# Patient Record
Sex: Male | Born: 1960
Health system: Southern US, Community
[De-identification: ages and names within clinical notes are randomized; demographics above are authoritative.]

## PROBLEM LIST (undated history)

## (undated) DIAGNOSIS — D229 Melanocytic nevi, unspecified: Secondary | ICD-10-CM

## (undated) DIAGNOSIS — K635 Polyp of colon: Secondary | ICD-10-CM

## (undated) DIAGNOSIS — N5082 Scrotal pain: Secondary | ICD-10-CM

## (undated) DIAGNOSIS — G4730 Sleep apnea, unspecified: Secondary | ICD-10-CM

## (undated) DIAGNOSIS — M19079 Primary osteoarthritis, unspecified ankle and foot: Secondary | ICD-10-CM

## (undated) DIAGNOSIS — H43399 Other vitreous opacities, unspecified eye: Secondary | ICD-10-CM

## (undated) DIAGNOSIS — E663 Overweight: Secondary | ICD-10-CM

## (undated) DIAGNOSIS — R0683 Snoring: Secondary | ICD-10-CM

## (undated) DIAGNOSIS — Z8249 Family history of ischemic heart disease and other diseases of the circulatory system: Secondary | ICD-10-CM

## (undated) DIAGNOSIS — D72819 Decreased white blood cell count, unspecified: Secondary | ICD-10-CM

## (undated) DIAGNOSIS — R109 Unspecified abdominal pain: Secondary | ICD-10-CM

## (undated) DIAGNOSIS — M5136 Other intervertebral disc degeneration, lumbar region: Secondary | ICD-10-CM

## (undated) DIAGNOSIS — M51369 Other intervertebral disc degeneration, lumbar region without mention of lumbar back pain or lower extremity pain: Secondary | ICD-10-CM

## (undated) DIAGNOSIS — E785 Hyperlipidemia, unspecified: Secondary | ICD-10-CM

## (undated) DIAGNOSIS — E781 Pure hyperglyceridemia: Secondary | ICD-10-CM

## (undated) HISTORY — DX: Melanocytic nevi, unspecified: D22.9

## (undated) HISTORY — DX: Other vitreous opacities, unspecified eye: H43.399

## (undated) HISTORY — DX: Overweight: E66.3

## (undated) HISTORY — DX: Pure hyperglyceridemia: E78.1

## (undated) HISTORY — DX: Snoring: R06.83

## (undated) HISTORY — DX: Hyperlipidemia, unspecified: E78.5

## (undated) HISTORY — DX: Decreased white blood cell count, unspecified: D72.819

## (undated) HISTORY — DX: Other intervertebral disc degeneration, lumbar region without mention of lumbar back pain or lower extremity pain: M51.369

## (undated) HISTORY — DX: Primary osteoarthritis, unspecified ankle and foot: M19.079

## (undated) HISTORY — DX: Unspecified abdominal pain: R10.9

## (undated) HISTORY — DX: Scrotal pain: N50.82

## (undated) HISTORY — DX: Polyp of colon: K63.5

## (undated) HISTORY — DX: Family history of ischemic heart disease and other diseases of the circulatory system: Z82.49

## (undated) HISTORY — DX: Other intervertebral disc degeneration, lumbar region: M51.36

---

## 2000-02-26 HISTORY — PX: VASECTOMY: SHX75

## 2006-02-25 HISTORY — PX: VASECTOMY REVERSAL: SHX243

## 2010-11-15 ENCOUNTER — Ambulatory Visit: Payer: Self-pay | Admitting: Internal Medicine

## 2011-02-26 HISTORY — PX: OTHER SURGICAL HISTORY: SHX169

## 2013-03-04 ENCOUNTER — Encounter: Payer: Self-pay | Admitting: Gastroenterology

## 2013-03-19 ENCOUNTER — Ambulatory Visit (AMBULATORY_SURGERY_CENTER): Payer: Self-pay | Admitting: *Deleted

## 2013-03-19 VITALS — Ht 71.0 in | Wt 194.2 lb

## 2013-03-19 DIAGNOSIS — Z1211 Encounter for screening for malignant neoplasm of colon: Secondary | ICD-10-CM

## 2013-03-19 MED ORDER — MOVIPREP 100 G PO SOLR: ORAL | Status: DC

## 2013-03-19 NOTE — Progress Notes (Signed)
No allergies to eggs or soy. No problems with anesthesia.  

## 2013-03-25 ENCOUNTER — Encounter: Payer: Self-pay | Admitting: Gastroenterology

## 2013-04-09 ENCOUNTER — Ambulatory Visit (AMBULATORY_SURGERY_CENTER): Payer: BC Managed Care – PPO | Admitting: Gastroenterology

## 2013-04-09 ENCOUNTER — Encounter: Payer: Self-pay | Admitting: Gastroenterology

## 2013-04-09 VITALS — BP 114/71 | HR 64 | Temp 95.9°F | Resp 17 | Ht 71.0 in | Wt 194.0 lb

## 2013-04-09 DIAGNOSIS — D126 Benign neoplasm of colon, unspecified: Secondary | ICD-10-CM

## 2013-04-09 DIAGNOSIS — Z1211 Encounter for screening for malignant neoplasm of colon: Secondary | ICD-10-CM

## 2013-04-09 DIAGNOSIS — K644 Residual hemorrhoidal skin tags: Secondary | ICD-10-CM

## 2013-04-09 MED ORDER — SODIUM CHLORIDE 0.9 % IV SOLN: 500.0000 mL | INTRAVENOUS | Status: DC

## 2013-04-09 NOTE — Op Note (Signed)
[facility]  [address]. [place], [address]   COLONOSCOPY PROCEDURE REPORT  PATIENT: Jason Duarte, Jason Duarte  MR#: [id number] BIRTHDATE: 1960/05/04 , 52  yrs. old GENDER: Male ENDOSCOPIST: Milus Banister, MD REFERRED LF:YBOF Virgina Jock, M.D. PROCEDURE DATE:  04/09/2013 PROCEDURE:   Colonoscopy with snare polypectomy First Screening Colonoscopy - Avg.  risk and is 50 yrs.  old or older Yes.  Prior Negative Screening - Now for repeat screening. N/A  History of Adenoma - Now for follow-up colonoscopy & has been > or = to 3 yrs.  N/A  Polyps Removed Today? Yes. ASA CLASS:   Class I INDICATIONS:average risk screening. MEDICATIONS: propofol (Diprivan) 200mg  IV and MAC sedation, administered by CRNA  DESCRIPTION OF PROCEDURE:   After the risks benefits and alternatives of the procedure were thoroughly explained, informed consent was obtained.  A digital rectal exam revealed no abnormalities of the rectum.   The LB [id number]  endoscope was introduced through the anus and advanced to the cecum, which was identified by both the appendix and ileocecal valve. No adverse events experienced.   The quality of the prep was excellent.  The instrument was then slowly withdrawn as the colon was fully examined.  COLON FINDINGS: One polyp was found, removed and sent to pathology. This was sessile, 4-26mm across, located in transverse segment, removed with cold snare.  There were small internal hemorrhoids. The examination was otherwise normal.  Retroflexed views revealed no abnormalities. The time to cecum=2 minutes 45 seconds. Withdrawal time=8 minutes 41 seconds.  The scope was withdrawn and the procedure completed. COMPLICATIONS: There were no complications.  ENDOSCOPIC IMPRESSION: One polyp was found, removed and sent to pathology. There were small internal hemorrhoids. The examination was otherwise normal.  RECOMMENDATIONS: If the polyp(s) removed today are proven to be  adenomatous (pre-cancerous) polyps, you will need a repeat colonoscopy in 5 years.  Otherwise you should continue to follow colorectal cancer screening guidelines for "routine risk" patients with colonoscopy in 10 years.  You will receive a letter within 1-2 weeks with the results of your biopsy as well as final recommendations.  Please call my office if you have not received a letter after 3 weeks.   eSigned:  Milus Banister, MD 04/09/2013 10:18 AM

## 2013-04-09 NOTE — Progress Notes (Signed)
Called to room to assist during endoscopic procedure.  Patient ID and intended procedure confirmed with present staff. Received instructions for my participation in the procedure from the performing physician.  

## 2013-04-09 NOTE — Progress Notes (Signed)
A/ox3 pleased with MAC, report to Karol RN 

## 2013-04-09 NOTE — Patient Instructions (Signed)
Information sheets given on Polyps, Hemorrhoids and  High Fiber Diet  YOU HAD AN ENDOSCOPIC PROCEDURE TODAY AT [facility]: Refer to the procedure report that was given to you for any specific questions about what was found during the examination.  If the procedure report does not answer your questions, please call your gastroenterologist to clarify.  If you requested that your care partner not be given the details of your procedure findings, then the procedure report has been included in a sealed envelope for you to review at your convenience later.  YOU SHOULD EXPECT: Some feelings of bloating in the abdomen. Passage of more gas than usual.  Walking can help get rid of the air that was put into your GI tract during the procedure and reduce the bloating. If you had a lower endoscopy (such as a colonoscopy or flexible sigmoidoscopy) you may notice spotting of blood in your stool or on the toilet paper. If you underwent a bowel prep for your procedure, then you may not have a normal bowel movement for a few days.  DIET: Your first meal following the procedure should be a light meal and then it is ok to progress to your normal diet.  A half-sandwich or bowl of soup is an example of a good first meal.  Heavy or fried foods are harder to digest and may make you feel nauseous or bloated.  Likewise meals heavy in dairy and vegetables can cause extra gas to form and this can also increase the bloating.  Drink plenty of fluids but you should avoid alcoholic beverages for 24 hours.  ACTIVITY: Your care partner should take you home directly after the procedure.  You should plan to take it easy, moving slowly for the rest of the day.  You can resume normal activity the day after the procedure however you should NOT DRIVE or use heavy machinery for 24 hours (because of the sedation medicines used during the test).    SYMPTOMS TO REPORT IMMEDIATELY: A gastroenterologist can be reached at any hour.   During normal business hours, 8:30 AM to 5:00 PM Monday through Friday, call [phone].  After hours and on weekends, please call the GI answering service at [phone] who will take a message and have the physician on call contact you.   Following lower endoscopy (colonoscopy or flexible sigmoidoscopy):  Excessive amounts of blood in the stool  Significant tenderness or worsening of abdominal pains  Swelling of the abdomen that is new, acute  FOLLOW UP: If any biopsies were taken you will be contacted by phone or by letter within the next 1-3 weeks.  Call your gastroenterologist if you have not heard about the biopsies in 3 weeks.  Our staff will call the home number listed on your records the next business day following your procedure to check on you and address any questions or concerns that you may have at that time regarding the information given to you following your procedure. This is a courtesy call and so if there is no answer at the home number and we have not heard from you through the emergency physician on call, we will assume that you have returned to your regular daily activities without incident.  SIGNATURES/CONFIDENTIALITY: You and/or your care partner have signed paperwork which will be entered into your electronic medical record.  These signatures attest to the fact that that the information above on your After Visit Summary has been reviewed and is understood.  Full  responsibility of the confidentiality of this discharge information lies with you and/or your care-partner.

## 2013-04-12 ENCOUNTER — Telehealth: Payer: Self-pay | Admitting: *Deleted

## 2013-04-12 NOTE — Telephone Encounter (Signed)
Lm on vm of number given in admitting to return call if problems. ewm 

## 2013-04-16 ENCOUNTER — Encounter: Payer: Self-pay | Admitting: Gastroenterology

## 2015-02-26 HISTORY — PX: LUMBAR DISC SURGERY: SHX700

## 2015-08-04 ENCOUNTER — Emergency Department (HOSPITAL_BASED_OUTPATIENT_CLINIC_OR_DEPARTMENT_OTHER): Payer: BLUE CROSS/BLUE SHIELD

## 2015-08-04 ENCOUNTER — Emergency Department (HOSPITAL_BASED_OUTPATIENT_CLINIC_OR_DEPARTMENT_OTHER)
Admission: EM | Admit: 2015-08-04 | Discharge: 2015-08-04 | Disposition: A | Discharge status: Home / Self Care | Payer: BLUE CROSS/BLUE SHIELD | Attending: Emergency Medicine | Admitting: Emergency Medicine

## 2015-08-04 ENCOUNTER — Encounter (HOSPITAL_BASED_OUTPATIENT_CLINIC_OR_DEPARTMENT_OTHER): Payer: Self-pay | Admitting: Emergency Medicine

## 2015-08-04 DIAGNOSIS — Z87891 Personal history of nicotine dependence: Secondary | ICD-10-CM | POA: Diagnosis not present

## 2015-08-04 DIAGNOSIS — R1010 Upper abdominal pain, unspecified: Secondary | ICD-10-CM | POA: Diagnosis not present

## 2015-08-04 DIAGNOSIS — R109 Unspecified abdominal pain: Secondary | ICD-10-CM

## 2015-08-04 DIAGNOSIS — K2970 Gastritis, unspecified, without bleeding: Secondary | ICD-10-CM

## 2015-08-04 LAB — COMPREHENSIVE METABOLIC PANEL
ALBUMIN: 4.5 g/dL (ref 3.5–5.0)
ALT: 37 U/L (ref 17–63)
AST: 34 U/L (ref 15–41)
Alkaline Phosphatase: 48 U/L (ref 38–126)
Anion gap: 13 (ref 5–15)
BILIRUBIN TOTAL: 1.3 mg/dL — AB (ref 0.3–1.2)
BUN: 18 mg/dL (ref 6–20)
CO2: 23 mmol/L (ref 22–32)
CREATININE: 0.93 mg/dL (ref 0.61–1.24)
Calcium: 9.9 mg/dL (ref 8.9–10.3)
Chloride: 99 mmol/L — ABNORMAL LOW (ref 101–111)
GFR calc Af Amer: >60 mL/min (ref >60)
GLUCOSE: 134 mg/dL — AB (ref 65–99)
Potassium: 3.3 mmol/L — ABNORMAL LOW (ref 3.5–5.1)
Sodium: 135 mmol/L (ref 135–145)
TOTAL PROTEIN: 7.7 g/dL (ref 6.5–8.1)

## 2015-08-04 LAB — LIPASE, BLOOD: LIPASE: 27 U/L (ref 11–51)

## 2015-08-04 LAB — CBC
HCT: 42.8 % (ref 39.0–52.0)
HEMOGLOBIN: 15.2 g/dL (ref 13.0–17.0)
MCH: 31.9 pg (ref 26.0–34.0)
MCHC: 35.5 g/dL (ref 30.0–36.0)
MCV: 89.9 fL (ref 78.0–100.0)
Platelets: 210 10*3/uL (ref 150–400)
RBC: 4.76 MIL/uL (ref 4.22–5.81)
RDW: 11.9 % (ref 11.5–15.5)
WBC: 9.2 10*3/uL (ref 4.0–10.5)

## 2015-08-04 LAB — URINALYSIS, ROUTINE W REFLEX MICROSCOPIC
Bilirubin Urine: NEGATIVE
GLUCOSE, UA: NEGATIVE mg/dL
HGB URINE DIPSTICK: NEGATIVE
KETONES UR: NEGATIVE mg/dL
Leukocytes, UA: NEGATIVE
Nitrite: NEGATIVE
PROTEIN: NEGATIVE mg/dL
Specific Gravity, Urine: 1.015 (ref 1.005–1.030)
pH: 7.5 (ref 5.0–8.0)

## 2015-08-04 MED ORDER — PANTOPRAZOLE SODIUM 20 MG PO TBEC: 20.0000 mg | DELAYED_RELEASE_TABLET | Freq: Every day | ORAL | Status: DC

## 2015-08-04 MED ORDER — METOCLOPRAMIDE HCL 5 MG/ML IJ SOLN
10.0000 mg | Freq: Once | INTRAMUSCULAR | Status: AC
Start: 2015-08-04 — End: 2015-08-04
  Administered 2015-08-04: 10 mg via INTRAVENOUS
  Filled 2015-08-04: qty 2

## 2015-08-04 MED ORDER — ONDANSETRON HCL 4 MG/2ML IJ SOLN
4.0000 mg | Freq: Once | INTRAMUSCULAR | Status: AC
  Administered 2015-08-04: 4 mg via INTRAVENOUS
  Filled 2015-08-04: qty 2

## 2015-08-04 MED ORDER — IOPAMIDOL (ISOVUE-300) INJECTION 61%
100.0000 mL | Freq: Once | INTRAVENOUS | Status: AC | PRN
  Administered 2015-08-04: 100 mL via INTRAVENOUS

## 2015-08-04 MED ORDER — SODIUM CHLORIDE 0.9 % IV BOLUS (SEPSIS)
1000.0000 mL | Freq: Once | INTRAVENOUS | Status: AC
  Administered 2015-08-04: 1000 mL via INTRAVENOUS

## 2015-08-04 MED ORDER — DIPHENHYDRAMINE HCL 50 MG/ML IJ SOLN
25.0000 mg | Freq: Once | INTRAMUSCULAR | Status: AC
  Administered 2015-08-04: 25 mg via INTRAVENOUS
  Filled 2015-08-04: qty 1

## 2015-08-04 MED ORDER — MORPHINE SULFATE (PF) 4 MG/ML IV SOLN
4.0000 mg | Freq: Once | INTRAVENOUS | Status: AC
  Administered 2015-08-04: 4 mg via INTRAVENOUS
  Filled 2015-08-04: qty 1

## 2015-08-04 NOTE — ED Notes (Signed)
Pt stating the pain in his abd has subsided and is now an "7"/10.

## 2015-08-04 NOTE — ED Provider Notes (Signed)
CSN: [id number]     Arrival date & time 08/04/15  1326 History   First MD Initiated Contact with Patient 08/04/15 1335     Chief Complaint  Patient presents with  . Abdominal Pain     (Consider location/radiation/quality/duration/timing/severity/associated sxs/prior Treatment) HPI  Pt presenting with c/o upper abdominal pain. He states pain began after eating a piece of pizza and drinking coke.  He states pain is burning in nature and severe.  He also c/o feeling lightheaded- he is hyperventilating.  He states that he tried to vomit but "nothing would come up".  He is currently taking steroids and muscle relaxer due to sciatica- he states the week prior to seeing his doctor this week he was taking a lot of ibuprofen due to his pain.  No fever/chills.  No change in bowel movements.  States he doesn't usually drink soda.  There are no other associated systemic symptoms, there are no other alleviating or modifying factors.   History reviewed. No pertinent past medical history. Past Surgical History  Procedure Laterality Date  . Removal foreign body leg Left 2013  . Vasectomy  2002  . Vasectomy reversal  2008   Family History  Problem Relation Age of Onset  . Colon cancer Neg Hx   . Esophageal cancer Neg Hx   . Rectal cancer Neg Hx   . Stomach cancer Neg Hx   . Prostate cancer Father    Social History  Substance Use Topics  . Smoking status: Former Smoker    Quit date: 02/25/2006  . Smokeless tobacco: Never Used  . Alcohol Use: 4.2 oz/week    7 Glasses of wine per week    Review of Systems  ROS reviewed and all otherwise negative except for mentioned in HPI    Allergies  Review of patient's allergies indicates no known allergies.  Home Medications   Prior to Admission medications   Medication Sig Start Date End Date Taking? Authorizing Provider  ibuprofen (ADVIL,MOTRIN) 200 MG tablet Take 200 mg by mouth every 6 (six) hours as needed.    Historical Provider, MD   BP  195/111 mmHg  Pulse 72  Temp(Src) 97.7 F (36.5 C) (Oral)  Resp 20  Ht 5\' 11"  (1.803 m)  Wt 190 lb (86.183 kg)  BMI 26.51 kg/m2  SpO2 100%  Vitals reviewed Physical Exam  Physical Examination: General appearance - alert, uncomfortable appearing, and in no distress Mental status - alert, oriented to person, place, and time Eyes - no conjunctival injection no scleral icterus Mouth - mucous membranes moist, pharynx normal without lesions Chest - clear to auscultation, no wheezes, rales or rhonchi, symmetric air entry Heart - normal rate, regular rhythm, normal S1, S2, no murmurs, rubs, clicks or gallops Abdomen - soft, diffuse tenderness to palpation- worse in epigastric region, no gaurding or rebound, nabs, nondistended, no masses or organomegaly Neurological - alert, oriented, normal speech Extremities - peripheral pulses normal, no pedal edema, no clubbing or cyanosis Skin - normal coloration and turgor, no rashes  ED Course  Procedures (including critical care time) Labs Review Labs Reviewed  COMPREHENSIVE METABOLIC PANEL - Abnormal; Notable for the following:    Potassium 3.3 (*)    Chloride 99 (*)    Glucose, Bld 134 (*)    Total Bilirubin 1.3 (*)    All other components within normal limits  CBC  LIPASE, BLOOD  URINALYSIS, ROUTINE W REFLEX MICROSCOPIC (NOT AT [facility])    Imaging Review Dg Abd Acute W/chest  08/04/2015  CLINICAL DATA:  Onset of severe abdominal pain 1 hour after eating a slice of pizza anechoic; severe headache; former smoker. EXAM: DG ABDOMEN ACUTE W/ 1V CHEST COMPARISON:  None in PACs FINDINGS: Chest x-ray: The lungs are adequately inflated. There is no focal infiltrate. The interstitial markings are coarse. There is no pleural effusion or pneumothorax. The bony thorax is unremarkable. Within the abdomen the bowel gas pattern is normal. The stool burden is moderate. There is gas in the rectum. There are no free extraluminal gas collections. There is a moderate  amount of fluid and small amount of gas within the stomach. There is linear density in the right upper quadrant measuring approximately 2 x 8 mm. Faint radiodensity projects over the upper pole of the left kidney measuring up to 2 mm in diameter. There are surgical clips overlying the scrotum. There is gentle dextrocurvature of the thoracolumbar spine centered at L1. The bony pelvis is unremarkable. IMPRESSION: 1. There is no acute cardiopulmonary abnormality. 2. No acute bowel abnormality is observed. 3. Faint calcifications projecting over both kidneys could reflect stones in the appropriate clinical setting. Electronically Signed   By: David  Martinique M.D.   On: 08/04/2015 14:38   I have personally reviewed and evaluated these images and lab results as part of my medical decision-making.   EKG Interpretation None      MDM   Final diagnoses:  Abdominal pain, unspecified abdominal location     3:59 PM on recheck patient is appearing very comfortable.  He is having no further abdominal pain.  He currently has a mild frontal headache.  Advised of plan for CT scan.  If this is reassuring will start on PPI at discharge.    Pt presenting with acute onset of abdominal pain and burning after eating pizza and coke today.  He is on steroids for back pain as well as having taken a lot of nsaids over the past couple of weeks.  Labs are reassuring, no acute findings on acute abdominal series.  Will obtain Abdominal CT scan- if this is negative for acute findings plan to discharge on PPI and f/u with PMD.  Pt signed out to Dr. Alfonse Spruce pending CT scan.  Pt updated about plan.    Alfonzo Beers, MD 08/04/15 [phone]

## 2015-08-04 NOTE — ED Notes (Addendum)
Pt immediately relieved by pain, smiling at friend at bedside within 5 min pt was screaming in pain and rocking back and forth reporting severe pain again, wife now at bedside, anxious requesting for patient to be transferred to a "real hospital", upon transfer to radiology, pt was in nad, lying on side and resting

## 2015-08-04 NOTE — ED Notes (Signed)
MD at bedside. 

## 2015-08-04 NOTE — Discharge Instructions (Signed)
Return to the ED with any concerns including vomiting and not able to keep down liquids, worsening abdominal pain, difficulty breathing, fainting, decreased level of alertness/lethargy, or any other alarming symptoms

## 2015-08-04 NOTE — ED Notes (Signed)
Patient transported to CT 

## 2015-08-04 NOTE — ED Notes (Signed)
Returned from radiology. Calm cooperative, pink warm and dry. C/c pain 8/10. IVF fluid bolus infusing.   Pt's visitor reassured and her behavior de-escalated.

## 2015-08-04 NOTE — ED Notes (Signed)
Patient reports that he ate a piece of pizza and started to have abdominal pain  - patient states that he has a Headache and is feeling lightheaded. The patient also is hyperventilating

## 2015-08-04 NOTE — ED Notes (Signed)
Patient transported to X-ray 

## 2015-12-06 ENCOUNTER — Ambulatory Visit (INDEPENDENT_AMBULATORY_CARE_PROVIDER_SITE_OTHER): Payer: BLUE CROSS/BLUE SHIELD | Admitting: Neurology

## 2015-12-06 ENCOUNTER — Encounter: Payer: Self-pay | Admitting: Neurology

## 2015-12-06 VITALS — BP 128/88 | HR 68 | Resp 20 | Ht 71.0 in | Wt 190.0 lb

## 2015-12-06 DIAGNOSIS — K031 Abrasion of teeth: Secondary | ICD-10-CM | POA: Diagnosis not present

## 2015-12-06 DIAGNOSIS — R0683 Snoring: Secondary | ICD-10-CM | POA: Diagnosis not present

## 2015-12-06 DIAGNOSIS — G4719 Other hypersomnia: Secondary | ICD-10-CM | POA: Diagnosis not present

## 2015-12-06 DIAGNOSIS — G4730 Sleep apnea, unspecified: Secondary | ICD-10-CM | POA: Diagnosis not present

## 2015-12-06 DIAGNOSIS — G4710 Hypersomnia, unspecified: Secondary | ICD-10-CM | POA: Insufficient documentation

## 2015-12-06 HISTORY — DX: Other hypersomnia: G47.19

## 2015-12-06 HISTORY — DX: Sleep apnea, unspecified: G47.30

## 2015-12-06 HISTORY — DX: Hypersomnia, unspecified: G47.10

## 2015-12-06 NOTE — Progress Notes (Signed)
SLEEP MEDICINE CLINIC   Provider:  Larey Seat, M D  Referring Provider: Shon Baton, MD Primary Care Physician:  Precious Reel, MD  Chief Complaint  Patient presents with  . New Patient (Initial Visit)    snoring, sleep study in 2014    HPI:  Jason Duarte is a 55 y.o. male , seen here as a referral  from Dr. Virgina Jock for a sleep re- evaluation,  Jason Duarte been seen in our sleep clinic before, and underwent a polysomnographic study on 03/18/2012. At the time he had endorsed a high Epworth sleepiness score 15 out of 24 points. His BMI was 27 he presented on only 2 medications at the time, Flonase and prednisone. He had a sleep efficiency of 92.5% and an AHI of 3.1 only in supine sleep was any apnea noted. He didn't have significant oxygen desaturations there was no sufficient explanation for his excessive daytime sleepiness based on the low apnea index. He Duarte had a dental snore guard which he used until recently when he started to have a re-alignment  orthodontic therapy. Having to wear this device about 22 hours a day did not allow to with a snore guard any longer. By now it would hardly fit due to the realignment. It Duarte been his wife Duarte noted him to snore again loudly and pushed for a reevaluation as well as his sister, works as a Designer, jewellery. Jason Duarte had also been evaluated for severe headaches including with an MRI of the brain. And he again is excessively daytime sleepy with an Epworth score of 17 points. For this reason the meet again today. He reports that he can sleep in most situations in life at anytime at any place. He also stated that he is getting sleepy when driving, but he always is sleeping when a passenger but that he Duarte been choking himself for air that he Duarte been witnessed to have apneas especially when sleeping in a seated position.  Chief complaint according to patient :  Very, Very sleepy.  Sleep habits are as follows:  His usual bedtime is  between 10:30 PM and 11 PM, and he is usually asleep promptly. The bedroom is cool, quiet and dark. He shares a bedroom with his wife, who is bothered her snoring. She is using a white noise machine in the bedroom. He usually sleeps soundly until about 1 or 2 AM, and this is when he is woken up by his wife usually because of his loud snoring and he Duarte found himself on his back. He may fall asleep on his side but inadvertently during sleep resumes the supine position, apparently always between 1 and 2 AM. He will be able to go back to sleep but he is awoken 2-3 times more at night whenever he rolls back on his back. Occasionally he will dream and remember his dreams but he does not report vivid dreams nightmares or night terrors. He usually Duarte no problem when waking up to differentiate dream from reality. He wakes up spontaneously around 6 AM to 6:30 AM ,he does not report morning grogginess but he is also not super energized. He Duarte a very dry mouth at night and often gets a drink of water. Sometimes he wakes up with headaches, but the headaches do not wake him. He Duarte isolated experience of sleep paralysis, usually following a crazy dreams. This is very infrequent. He always wakes up with stiff legs. His wife reports no twitching or PLMs. No  bruxism.  He eats lunch at work , high protein and a fruit , one hour later he is excessively sleepy- "crashes ". He struggles to stay awake whenever not physically active or mentally stimulated.  Sleep medical history and family sleep history:  The patient's biological brother is also using CPAP. He Duarte used a bruxism device in the past, he Duarte used a snoring device in the past. He does not have a history of neck or traumatic brain injuries, facial or skull injuries, his tonsils were never surgically removed. On August 31 he underwent a microdiscectomy L4-L5, and post surgically Duarte regained some weight that he had previously lost.  Social history:   Married, works  days. 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 Since then he Duarte noticed that he Duarte difficulties going to sleep if it later than 1 or 2 AM. No tobacco use, alcohol socially 6-10 drinks per week, caffeine use 2 cups of coffee a day, but neither soda nor iced tea.   Review of Systems: Out of a complete 14 system review, the patient complains of only the following symptoms, and all other reviewed systems are negative. EDS 17 Epworth, all his life. Sleepy when driving, cannot read without struggling to stay awake.   Epworth score 17 , Fatigue severity score 26  , depression score 2/15    Social History   Social History  . Marital status: Married    Spouse name: N/A  . Number of children: N/A  . Years of education: N/A   Occupational History  . Not on file.   Social History Main Topics  . Smoking status: Former Smoker    Quit date: 02/25/2006  . Smokeless tobacco: Never Used  . Alcohol use 4.2 oz/week    7 Glasses of wine per week  . Drug use: No  . Sexual activity: Not on file   Other Topics Concern  . Not on file   Social History Narrative  . No narrative on file    Family History  Problem Relation Age of Onset  . Colon cancer Neg Hx   . Esophageal cancer Neg Hx   . Rectal cancer Neg Hx   . Stomach cancer Neg Hx   . Prostate cancer Father     No past medical history on file.  Past Surgical History:  Procedure Laterality Date  . removal foreign body leg Left 2013  . VASECTOMY  2002  . VASECTOMY REVERSAL  2008    No current outpatient prescriptions on file.   No current facility-administered medications for this visit.     Allergies as of 12/06/2015  . (No Known Allergies)    Vitals: BP 128/88   Pulse 68   Resp 20   Ht 5\' 11"  (1.803 m)   Wt 190 lb (86.2 kg)   BMI 26.50 kg/m  Last Weight:  Wt Readings from Last 1 Encounters:  12/06/15 190 lb (86.2 kg)   [id number] mass index is 26.5 kg/m.     Last Height:   Ht Readings from Last 1 Encounters:    12/06/15 5\' 11"  (1.803 m)    Physical exam:  General: The patient is awake, alert and appears not in acute distress. The patient is well groomed. Head: Normocephalic, atraumatic. Neck is supple. Mallampati 2-3 with dental guard,  neck circumference: 15. Nasal airflow  Patent , TMJ click is evident . Retrognathia is seen.  Cardiovascular:  Regular rate and rhythm, without  murmurs or carotid bruit, and  without distended neck veins. Respiratory: Lungs are clear to auscultation. Skin:  Without evidence of edema, or rash Trunk: BMI is normal . The patient's posture is erect   Neurologic exam : The patient is awake and alert, oriented to place and time.   Memory subjective described as intact.  Attention span & concentration ability appears normal.  Speech is fluent,  without dysarthria, dysphonia or aphasia.  Mood and affect are appropriate. Cranial nerves:Pupils are equal and briskly reactive to light.  Extraocular movements  in vertical and horizontal planes intact and without nystagmus. Visual fields by finger perimetry are intact. Hearing to finger rub intact. Facial sensation intact to fine touch. Facial motor strength is symmetric and tongue and uvula move midline. Shoulder shrug was symmetrical.  Motor exam:  Normal tone, muscle bulk and symmetric strength in all extremities. Sensory:  Fine touch, pinprick and vibration werenormal. Coordination:  Finger-to-nose maneuver without evidence of ataxia, dysmetria or tremor. Gait and station: Patient walks without assistive device and is able unassisted to climb up to the exam table. Strength within normal limits.Stance is stable and normal.  Deep tendon reflexes: in the  upper and lower extremities are symmetric and intact. Babinski maneuver response is downgoing.  The patient was advised of the nature of the diagnosed sleep disorder , the treatment options and risks for general a health and wellness arising from not treating the condition.   I spent more than 40 minutes of face to face time with the patient. Greater than 50% of time was spent in counseling and coordination of care. We have discussed the diagnosis and differential and I answered the patient's questions.     Assessment:  After physical and neurologic examination, review of laboratory studies,  Personal review of imaging studies, reports of other /same  Imaging studies ,  Results of polysomnography/ neurophysiology testing and pre-existing records as far as provided in visit., my assessment is   1) I have reviewed the previous sleep study with the patient which was an excellent study with typical sleep architecture almost textbook like, and respiratory events only in supine position worsening during REM sleep. This fits also the clinical description of his sleep at home as given by his wife. What concerns me is that this father mildly apneic patient Duarte such a high degree of excessive daytime sleepiness.. What I would like to do is to repeat a sleep study but follow it was an MS LT. If again only positional apnea is documented I will recommend the tennis ball method is the behavior changing to oral to avoid supine sleep. If snoring is present and it appears that it truly disturbs his wife's sleep, I would be willing to refer him for another dental device as soon as his invisa- line treatment is completed.  2) no narcotic pain medication   3)non smoker, but social drinker, reduce to 1 drink a day.   Plan:  Treatment plan and additional workup :  PSG followed by [facility].    Asencion Partridge Sundiata Ferrick MD  12/06/2015   CC: Shon Baton, [address] [place], [place] [id number]

## 2015-12-06 NOTE — Patient Instructions (Signed)
Hypersomnia Hypersomnia is when you feel extremely tired during the day even though you're getting plenty of sleep at night. You may need to take naps during the day, and you may also be extremely difficult to wake up when you are sleeping.  CAUSES  The cause of your hypersomnia may not be known. Hypersomnia may be caused by:   Medicines.  Sleep disorders, such as narcolepsy.  Trauma or injury to your head or nervous system.  Using drugs or alcohol.  Tumors.  Medical conditions, such as depression or hypothyroidism.  Genetics. SIGNS AND SYMPTOMS  The main symptoms of hypersomnia include:   Feeling extremely tired throughout the day.  Being very difficult to wake up.  Sleeping for longer and longer periods.  Taking naps throughout the day. Other symptoms may include:   Feeling:  Restless.  Annoyed.  Anxious.  Low energy.  Having difficulty:  Remembering.  Speaking.  Thinking.  Losing your appetite.  Experiencing hallucinations. DIAGNOSIS  Hypersomnia may be diagnosed by:  Medical history and physical exam. This will include a sleep history.  Completing sleep logs.  Tests may also be done, such as:  Polysomnography.  Multiple sleep latency test (MSLT). TREATMENT  There is no cure for hypersomnia, but treatment can be very effective in helping manage the condition. Treatment may include:  Lifestyle and sleeping strategies to help cope with the condition.  Stimulant medicines.  Treating any underlying causes of hypersomnia. HOME CARE INSTRUCTIONS  Take medicines only as directed by your health care provider.  Schedule short naps for when you feel sleepiest during the day. Tell your employer or teachers that you have hypersomnia. You may be able to adjust your schedule to include time for naps.  Avoid drinking alcohol or caffeinated beverages.  Do not eat a heavy meal before bedtime. Eat at about the same times every day.  Do not drive or  operate heavy machinery if you are sleepy.  Do not swim or go out on the water without a life jacket.  If possible, adjust your schedule so that you do not have to work or be active at night.  Keep all follow-up visits as directed by your health care provider. This is important. SEEK MEDICAL CARE IF:   You have new symptoms.  Your symptoms get worse. SEEK IMMEDIATE MEDICAL CARE IF:  You have serious thoughts of hurting yourself or someone else.   This information is not intended to replace advice given to you by your health care provider. Make sure you discuss any questions you have with your health care provider.   Document Released: 02/01/2002 Document Revised: 03/04/2014 Document Reviewed: 09/16/2013 Elsevier Interactive Patient Education 2016 Elsevier Inc. Sleep Apnea Sleep apnea is disorder that affects a person's sleep. A person with sleep apnea has abnormal pauses in their breathing when they sleep. It is hard for them to get a good sleep. This makes a person tired during the day. It also can lead to other physical problems. There are three types of sleep apnea. One type is when breathing stops for a short time because your airway is blocked (obstructive sleep apnea). Another type is when the brain sometimes fails to give the normal signal to breathe to the muscles that control your breathing (central sleep apnea). The third type is a combination of the other two types. HOME CARE  Do not sleep on your back. Try to sleep on your side.  Take all medicine as told by your doctor.  Avoid alcohol,  calming medicines (sedatives), and depressant drugs.  Try to lose weight if you are overweight. Talk to your doctor about a healthy weight goal. Your doctor may have you use a device that helps to open your airway. It can help you get the air that you need. It is called a positive airway pressure (PAP) device. There are three types of PAP devices:  Continuous positive airway pressure  (CPAP) device.  Nasal expiratory positive airway pressure (EPAP) device.  Bilevel positive airway pressure (BPAP) device. MAKE SURE YOU:  Understand these instructions.  Will watch your condition.  Will get help right away if you are not doing well or get worse.   This information is not intended to replace advice given to you by your health care provider. Make sure you discuss any questions you have with your health care provider.   Document Released: 11/21/2007 Document Revised: 03/04/2014 Document Reviewed: 06/15/2011 Elsevier Interactive Patient Education [employer].

## 2016-02-20 ENCOUNTER — Ambulatory Visit (INDEPENDENT_AMBULATORY_CARE_PROVIDER_SITE_OTHER): Payer: BLUE CROSS/BLUE SHIELD | Admitting: Neurology

## 2016-02-20 DIAGNOSIS — G4730 Sleep apnea, unspecified: Secondary | ICD-10-CM

## 2016-02-20 DIAGNOSIS — G4719 Other hypersomnia: Secondary | ICD-10-CM

## 2016-02-20 DIAGNOSIS — G4710 Hypersomnia, unspecified: Secondary | ICD-10-CM | POA: Diagnosis not present

## 2016-02-20 DIAGNOSIS — R0683 Snoring: Secondary | ICD-10-CM

## 2016-02-23 ENCOUNTER — Telehealth: Payer: Self-pay | Admitting: Neurology

## 2016-02-23 DIAGNOSIS — G4710 Hypersomnia, unspecified: Secondary | ICD-10-CM

## 2016-02-23 DIAGNOSIS — G4733 Obstructive sleep apnea (adult) (pediatric): Secondary | ICD-10-CM

## 2016-02-23 DIAGNOSIS — G4730 Sleep apnea, unspecified: Principal | ICD-10-CM

## 2016-02-23 NOTE — Procedures (Signed)
PATIENT'S NAME:  Jason, Duarte DOB:      04/24/1960      MR#:    [id number]     DATE OF RECORDING: 02/20/2016 REFERRING M.D.:  Shon Baton, MD Study Performed:   Baseline Polysomnogram HISTORY:  Jason Duarte is a 55 y.o. male, seen here as a referral from Dr. Virgina Jock for a sleep re- evaluation. The patient was evaluated by PSG 03-18-2012 and endorsed at that time the Epworth score at 15, had an AHI of only 3.1 , and no explanation for his daytime sleepiness was found. He snored and had meanwhile tried a dental guard, but the device no longer fits.  He remains sleepy and suffers from frequent headaches, still snores.  The patient endorsed the Epworth Sleepiness Scale at 17 points.   The patient's weight 190 pounds with a height of 71 (inches), resulting in a BMI of 26.5 kg/m2.The patient's neck circumference measured 15 inches.  CURRENT MEDICATIONS: No current meds on file   PROCEDURE:  This is a multichannel digital polysomnogram utilizing the Somnostar 11.2 system.  Electrodes and sensors were applied and monitored per AASM Specifications.   EEG, EOG, Chin and Limb EMG, were sampled at 200 Hz.  ECG, Snore and Nasal Pressure, Thermal Airflow, Respiratory Effort, CPAP Flow and Pressure, Oximetry was sampled at 50 Hz. Digital video and audio were recorded.      BASELINE STUDY : Lights Out was at 21:01 and Lights On at 05:59.  Total recording time (TRT) was 539 minutes, with a total sleep time (TST) of 438 minutes.   The patient's sleep latency was 69.5 minutes.  REM latency was 154 minutes.  The sleep efficiency was 81.3 %.     SLEEP ARCHITECTURE: WASO (Wake after sleep onset) was 31.5 minutes.  There were 16.5 minutes in Stage N1, 284 minutes Stage N2, 57 minutes Stage N3 and 80.5 minutes in Stage REM.  The percentage of Stage N1 was 3.8%, Stage N2 was 64.8%, Stage N3 was 13.% and Stage R (REM sleep) was 18.4%.   RESPIRATORY ANALYSIS:  There were a total of 102 respiratory events:  41 obstructive  apneas, 1 central apnea and 2 mixed apneas with a total of 44 apneas and an apnea index (AI) of 6.0/hour. There were additionally 58 hypopneas with a hypopnea index of 7.9 /hour.  The patient also had 0 respiratory event related arousals (RERAs).     The total APNEA/HYPOPNEA INDEX (AHI) was 14.0/hour and the total RESPIRATORY DISTURBANCE INDEX was 14.0/ hr.  40 events occurred in REM sleep and 80 events in NREM.  The REM AHI was 29.8 /hour, versus a non-REM AHI of 10.4. The patient spent 172.5 minutes of total sleep time in the supine position and 266 minutes in non-supine.  The supine AHI was 13.9 versus a non-supine AHI of 14.0.  OXYGEN SATURATION & C02:  The Wake baseline 02 saturation was 96%, with the lowest being 75%. Time spent below 89% saturation equaled 33 minutes.   PERIODIC LIMB MOVEMENTS:   The patient had a total of 0 Periodic Limb Movements.   The arousals were noted as: 127 were spontaneous, 0 were associated with PLMs, and 71 were associated with respiratory events.  Audio and video analysis did not show any abnormal or unusual movements, behaviors, phonations or vocalizations.   The patient took one bathroom break. Loud intermittent snoring was noted. EKG was in keeping with normal sinus rhythm (NSR).  IMPRESSION:  1. Obstructive Sleep Apnea(OSA) with an  AHI of 14.0/ hr. REM AHI was 29.8 /hr. This degree of apnea was the main finding.  Hypoxemia of 33 minutes was noted, can be contributing to sleepiness and headaches.  2. Loud Snoring 3. No PLM  RECOMMENDATIONS: OSA was the main finding, and needs to be treated by CPAP , based on REM accentuation and hypoxemia. CPAP treats snoring as well.  After 30-60 days on CPAP treatment we will need to re-evaluate for Epworth score and headache response. MSLT was not performed.   1. Advise full-night, attended, CPAP titration study to optimize therapy.   2. Further information regarding OSA may be obtained from [employer] (www.sleepfoundation.org) or American Sleep Apnea Association (www.sleepapnea.org). 3. If there is a problem with claustrophobia, CPAP desensitization protocol should be considered.  This may be arranged directly with [facility].   4. A follow up appointment will be scheduled in the Sleep Clinic at [facility]. The referring provider will be notified of the results.      I certify that I have reviewed the entire raw data recording prior to the issuance of this report in accordance with the Standards of Accreditation of the American Academy of Sleep Medicine (AASM)   Larey Seat, MD  02-23-2016 Diplomat, American Board of Psychiatry and Neurology  Diplomat, American Board of Sleep Medicine Medical Director of [facility] at [facility], an [facility] accredited facility.

## 2016-02-23 NOTE — Telephone Encounter (Signed)
1. Obstructive Sleep Apnea(OSA) with an AHI of 14.0/ hr. REM AHI was 29.8 /hr. This degree of apnea was the main finding.  Hypoxemia of 33 minutes was noted, can be contributing to sleepiness and headaches.  2. Loud Snoring 3. No PLM  RECOMMENDATIONS: OSA was the main finding, and needs to be treated by CPAP , based on REM accentuation and hypoxemia. CPAP treats snoring as well.  After 30-60 days on CPAP treatment we will need to re-evaluate for Epworth score and headache response.

## 2016-02-28 NOTE — Telephone Encounter (Signed)
Patients wife Marcie Bal called in reference to sleep study results.  If we can call her with results due to patient working.

## 2016-02-29 NOTE — Telephone Encounter (Signed)
I called pt to discuss sleep study results. Left a message on pt's home and mobile number to call me back.

## 2016-02-29 NOTE — Telephone Encounter (Signed)
If pt calls back on Friday, please advise him that I will be back in the office Monday and will return his call then.

## 2016-03-01 NOTE — Telephone Encounter (Signed)
Pt's wife returned the cal. Relayed the msg

## 2016-03-04 NOTE — Telephone Encounter (Signed)
I called and left a message on both home and cell number asking pt to call me back.

## 2016-03-05 NOTE — Telephone Encounter (Signed)
I called pt's wife and advised her that pt does have sleep apnea with hypoxemia and treatment for this is in the form of cpap. Pt should be scheduled for a cpap titration. Pt's wife asked that I mail her a copy of the pt's sleep study. I verified pt's address with pt's wife. Pt's wife then asked me to call pt and explain all of this to him. I called pt and explained sleep study results again to him. Pt is agreeable to scheduling a cpap titration. Pt verbalized understanding of results. Pt had no questions at this time but was encouraged to call back if questions arise.

## 2016-03-05 NOTE — Telephone Encounter (Signed)
Patients wife returning call to discuss sleep study results.  Please call between 1:05pm-1:55pm.

## 2016-03-05 NOTE — Telephone Encounter (Signed)
Patient's wife calling back saying she can be reached this morning before 8:45am or from 1:05pm-1:55pm this afternoon.

## 2016-03-29 ENCOUNTER — Ambulatory Visit (INDEPENDENT_AMBULATORY_CARE_PROVIDER_SITE_OTHER): Payer: BLUE CROSS/BLUE SHIELD | Admitting: Neurology

## 2016-03-29 DIAGNOSIS — G4733 Obstructive sleep apnea (adult) (pediatric): Secondary | ICD-10-CM

## 2016-03-29 DIAGNOSIS — G4710 Hypersomnia, unspecified: Secondary | ICD-10-CM

## 2016-03-29 DIAGNOSIS — G4730 Sleep apnea, unspecified: Principal | ICD-10-CM

## 2016-04-01 ENCOUNTER — Telehealth: Payer: Self-pay | Admitting: Neurology

## 2016-04-01 DIAGNOSIS — G4733 Obstructive sleep apnea (adult) (pediatric): Secondary | ICD-10-CM

## 2016-04-01 NOTE — Procedures (Signed)
PATIENT'S NAME:  Jason Duarte, Jason Duarte DOB:      April 09, 1960      MR#:    [id number]     DATE OF RECORDING: 03/29/2016 REFERRING M.D.:  Shon Baton, MD Study Performed:   CPAP  Titration HISTORY:  Return for CPAP titration, following a PSG at [place] on 02-20-2016, revealing OSA with an AHI of 14 .0/hr. and REM AHI was 29.8, Hypoxemia time was 33 minute in toto.  The patient endorsed the Epworth Sleepiness Scale at 17/24 points.   The patient's weight 190 pounds with a height of 71 (inches), resulting in a BMI of 26.5 kg/m2. The patient's neck circumference measured 15 inches.  PROCEDURE:  This is a multichannel digital polysomnogram utilizing the SomnoStar 11.2 system.  Electrodes and sensors were applied and monitored per AASM Specifications.   EEG, EOG, Chin and Limb EMG, were sampled at 200 Hz.  ECG, Snore and Nasal Pressure, Thermal Airflow, Respiratory Effort, CPAP Flow and Pressure, Oximetry was sampled at 50 Hz. Digital video and audio were recorded.      CPAP was initiated at 5 cmH20 with heated humidity per AASM split night standards and pressure was advanced to 13 cmH20 because of hypopneas, apneas and desaturations.  At a PAP pressure of 11 cmH20, there was a reduction of the AHI to 1.1 with nadir SpO12 at 90% and sleep efficiency of 96%.  Lights Out was at 22:11 and Lights On at 05:02. Total recording time (TRT) was 411 minutes, with a total sleep time (TST) of 341.5 minutes. The patient's sleep latency was 13 minutes with 2.5 minutes of wake time after sleep onset. REM latency was 64.5 minutes.  The sleep efficiency was 83.1 %.    SLEEP ARCHITECTURE: WASO (Wake after sleep onset) was 59 minutes.  There were 6 minutes in Stage N1, 286 minutes Stage N2, 0 minutes Stage N3 and 49.5 minutes in Stage REM.  The percentage of Stage N1 was 1.8%, Stage N2 was 83.7%, Stage N3 was 0% and Stage R (REM sleep) was 14.5%.   RESPIRATORY ANALYSIS:  There were 13 respiratory events: 0 obstructive apneas, 1 central  apnea and 12 hypopneas with 8 respiratory event related arousals (RERAs).   UARS is present.   The total APNEA/HYPOPNEA INDEX  (AHI) was 2.3 /hour and the total RESPIRATORY DISTURBANCE INDEX was 3.7/ hour  10 events occurred in REM sleep and 3 events in NREM. The REM AHI was 12.1 /hour versus a non-REM AHI of .6 /hour.  The patient spent 196.5 minutes of total sleep time in the supine position and 145 minutes in non-supine. The supine AHI was 3.7, versus a non-supine AHI of 0.4.  OXYGEN SATURATION & C02:  The baseline 02 saturation was 97%, with the lowest being 79%. Time spent below 89% saturation equaled 7 minutes.  PERIODIC LIMB MOVEMENTS:    The patient had a total of 14 Periodic Limb Movements. The Periodic Limb Movement (PLM) index was 2.5 and the PLM Arousal index was 0 /hour   The arousals were noted as: 41 were spontaneous, 0 were associated with PLMs, and 14 were associated with respiratory events. Audio and video analysis did not show any abnormal or unusual movements, behaviors, phonations or vocalizations.  EKG was in keeping with normal sinus rhythm (NSR).   DIAGNOSIS OSA, sufficiently treated under CAP pressures between 10 and 13 cm water/ The patient was fitted with a Eson nasal mask by [employer] in medium size.   PLANS/RECOMMENDATIONS:  CPAP  setting of 13 cm water with 2 cm EPR and heated humidity. The patient was fitted with a Eson nasal mask by [employer] in medium size.  Follow up after 30-60 days of CPAP treatment at [facility]. The machine is to be brought to the appointment.   A follow up appointment will be scheduled in the Sleep Clinic at [facility].   Please call [phone] with any questions.      I certify that I have reviewed the entire raw data recording prior to the issuance of this report in accordance with the Standards of Accreditation of the American Academy of Sleep Medicine (AASM)     Larey Seat, M.D.   04-02-2015 Diplomat, American Board of Psychiatry and Neurology  Diplomat, [place] of Sleep Medicine Medical Director, [place] [facility] at [facility]

## 2016-04-01 NOTE — Telephone Encounter (Signed)
DIAGNOSIS Patient with strongly supine dependent OSA, sufficiently treated under CAP pressures between 10 and 13 cm water/ The patient was fitted with a Eson nasal mask by [employer] in medium size.   PLANS/RECOMMENDATIONS:  CPAP setting of 13 cm water with 2 cm EPR and heated humidity. The patient was fitted with a Eson nasal mask by [employer] in medium size.  Follow up after 30-60 days of CPAP treatment at [facility]. The machine is to be brought to the appointment.   A follow up appointment will be scheduled in the Sleep Clinic at [facility].   Please call [phone] with any questions.

## 2016-04-02 NOTE — Telephone Encounter (Signed)
I spoke to patient and he is aware of results and recommendations. He is willing to start treatment. I will send orders to [facility]. PCP will get a copy of the study. Patient is unsure if he will get his machine off line or through insurance due to his high deductible.

## 2016-04-02 NOTE — Telephone Encounter (Signed)
-----   Message from Larey Seat, MD sent at 04/01/2016  4:55 PM EST ----- DIAGNOSIS Patient with strongly supine dependent OSA, sufficiently treated under CAP pressures between 10 and 13 cm water/ The patient was fitted with a Eson nasal mask by [employer] in medium size.   PLANS/RECOMMENDATIONS:  CPAP setting of 13 cm water with 2 cm EPR and heated humidity. The patient was fitted with a Eson nasal mask by [employer] in medium size.  Follow up after 30-60 days of CPAP treatment at [facility]. The machine is to be brought to the appointment.   A follow up appointment will be scheduled in the Sleep Clinic at [facility].   Please call [phone] with any questions.

## 2017-03-11 DIAGNOSIS — Z0000 Encounter for general adult medical examination without abnormal findings: Secondary | ICD-10-CM | POA: Diagnosis not present

## 2017-03-11 DIAGNOSIS — Z125 Encounter for screening for malignant neoplasm of prostate: Secondary | ICD-10-CM | POA: Diagnosis not present

## 2017-03-18 ENCOUNTER — Other Ambulatory Visit: Payer: Self-pay | Admitting: Internal Medicine

## 2017-03-18 DIAGNOSIS — Z8249 Family history of ischemic heart disease and other diseases of the circulatory system: Secondary | ICD-10-CM

## 2017-03-18 DIAGNOSIS — M5136 Other intervertebral disc degeneration, lumbar region: Secondary | ICD-10-CM | POA: Diagnosis not present

## 2017-03-18 DIAGNOSIS — Z1389 Encounter for screening for other disorder: Secondary | ICD-10-CM | POA: Diagnosis not present

## 2017-03-18 DIAGNOSIS — E663 Overweight: Secondary | ICD-10-CM | POA: Diagnosis not present

## 2017-03-18 DIAGNOSIS — D72819 Decreased white blood cell count, unspecified: Secondary | ICD-10-CM | POA: Diagnosis not present

## 2017-03-18 DIAGNOSIS — Z0000 Encounter for general adult medical examination without abnormal findings: Secondary | ICD-10-CM | POA: Diagnosis not present

## 2017-03-18 DIAGNOSIS — E781 Pure hyperglyceridemia: Secondary | ICD-10-CM

## 2017-03-21 DIAGNOSIS — Z1212 Encounter for screening for malignant neoplasm of rectum: Secondary | ICD-10-CM | POA: Diagnosis not present

## 2017-11-14 DIAGNOSIS — G4733 Obstructive sleep apnea (adult) (pediatric): Secondary | ICD-10-CM | POA: Diagnosis not present

## 2018-02-13 DIAGNOSIS — G4733 Obstructive sleep apnea (adult) (pediatric): Secondary | ICD-10-CM | POA: Diagnosis not present

## 2018-03-13 DIAGNOSIS — Z125 Encounter for screening for malignant neoplasm of prostate: Secondary | ICD-10-CM | POA: Diagnosis not present

## 2018-03-13 DIAGNOSIS — Z0000 Encounter for general adult medical examination without abnormal findings: Secondary | ICD-10-CM | POA: Diagnosis not present

## 2018-03-13 DIAGNOSIS — E7849 Other hyperlipidemia: Secondary | ICD-10-CM | POA: Diagnosis not present

## 2018-03-20 DIAGNOSIS — Z1331 Encounter for screening for depression: Secondary | ICD-10-CM | POA: Diagnosis not present

## 2018-03-20 DIAGNOSIS — D229 Melanocytic nevi, unspecified: Secondary | ICD-10-CM | POA: Diagnosis not present

## 2018-03-20 DIAGNOSIS — Z0000 Encounter for general adult medical examination without abnormal findings: Secondary | ICD-10-CM | POA: Diagnosis not present

## 2018-03-20 DIAGNOSIS — K635 Polyp of colon: Secondary | ICD-10-CM | POA: Diagnosis not present

## 2018-03-20 DIAGNOSIS — Z8249 Family history of ischemic heart disease and other diseases of the circulatory system: Secondary | ICD-10-CM | POA: Diagnosis not present

## 2018-03-20 DIAGNOSIS — E7849 Other hyperlipidemia: Secondary | ICD-10-CM | POA: Diagnosis not present

## 2018-03-20 DIAGNOSIS — Z1212 Encounter for screening for malignant neoplasm of rectum: Secondary | ICD-10-CM | POA: Diagnosis not present

## 2018-04-25 ENCOUNTER — Encounter: Payer: Self-pay | Admitting: Gastroenterology

## 2018-08-04 DIAGNOSIS — G4733 Obstructive sleep apnea (adult) (pediatric): Secondary | ICD-10-CM | POA: Diagnosis not present

## 2018-11-04 DIAGNOSIS — G4733 Obstructive sleep apnea (adult) (pediatric): Secondary | ICD-10-CM | POA: Diagnosis not present

## 2018-11-13 DIAGNOSIS — M5136 Other intervertebral disc degeneration, lumbar region: Secondary | ICD-10-CM | POA: Diagnosis not present

## 2018-11-13 DIAGNOSIS — M4722 Other spondylosis with radiculopathy, cervical region: Secondary | ICD-10-CM | POA: Diagnosis not present

## 2018-11-13 DIAGNOSIS — M5412 Radiculopathy, cervical region: Secondary | ICD-10-CM | POA: Diagnosis not present

## 2018-11-13 DIAGNOSIS — M47816 Spondylosis without myelopathy or radiculopathy, lumbar region: Secondary | ICD-10-CM | POA: Diagnosis not present

## 2018-11-13 DIAGNOSIS — M5030 Other cervical disc degeneration, unspecified cervical region: Secondary | ICD-10-CM | POA: Diagnosis not present

## 2019-01-20 DIAGNOSIS — N5082 Scrotal pain: Secondary | ICD-10-CM | POA: Diagnosis not present

## 2019-01-20 DIAGNOSIS — K635 Polyp of colon: Secondary | ICD-10-CM | POA: Diagnosis not present

## 2019-01-20 DIAGNOSIS — N5089 Other specified disorders of the male genital organs: Secondary | ICD-10-CM | POA: Diagnosis not present

## 2019-01-25 ENCOUNTER — Other Ambulatory Visit: Payer: Self-pay | Admitting: Internal Medicine

## 2019-01-25 DIAGNOSIS — N5089 Other specified disorders of the male genital organs: Secondary | ICD-10-CM

## 2019-01-27 ENCOUNTER — Encounter: Payer: Self-pay | Admitting: Gastroenterology

## 2019-01-28 ENCOUNTER — Ambulatory Visit
Admission: RE | Admit: 2019-01-28 | Discharge: 2019-01-28 | Disposition: A | Discharge status: Home / Self Care | Payer: BLUE CROSS/BLUE SHIELD | Source: Ambulatory Visit | Attending: Internal Medicine | Admitting: Internal Medicine

## 2019-01-28 DIAGNOSIS — N433 Hydrocele, unspecified: Secondary | ICD-10-CM | POA: Diagnosis not present

## 2019-01-28 DIAGNOSIS — N5089 Other specified disorders of the male genital organs: Secondary | ICD-10-CM

## 2019-02-05 ENCOUNTER — Ambulatory Visit (AMBULATORY_SURGERY_CENTER): Payer: BC Managed Care – PPO | Admitting: *Deleted

## 2019-02-05 ENCOUNTER — Other Ambulatory Visit: Payer: Self-pay

## 2019-02-05 ENCOUNTER — Encounter: Payer: Self-pay | Admitting: Gastroenterology

## 2019-02-05 VITALS — Temp 96.7°F | Ht 71.0 in | Wt 200.0 lb

## 2019-02-05 DIAGNOSIS — Z1159 Encounter for screening for other viral diseases: Secondary | ICD-10-CM

## 2019-02-05 DIAGNOSIS — Z86010 Personal history of colonic polyps: Secondary | ICD-10-CM

## 2019-02-05 MED ORDER — NA SULFATE-K SULFATE-MG SULF 17.5-3.13-1.6 GM/177ML PO SOLN: 1.0000 | Freq: Once | ORAL | 0 refills | Status: AC

## 2019-02-05 NOTE — Progress Notes (Signed)

## 2019-02-15 ENCOUNTER — Other Ambulatory Visit: Payer: Self-pay | Admitting: Gastroenterology

## 2019-02-15 ENCOUNTER — Ambulatory Visit (INDEPENDENT_AMBULATORY_CARE_PROVIDER_SITE_OTHER): Payer: BC Managed Care – PPO

## 2019-02-15 DIAGNOSIS — Z1159 Encounter for screening for other viral diseases: Secondary | ICD-10-CM

## 2019-02-16 DIAGNOSIS — G4733 Obstructive sleep apnea (adult) (pediatric): Secondary | ICD-10-CM | POA: Diagnosis not present

## 2019-02-16 LAB — SARS CORONAVIRUS 2 (TAT 6-24 HRS): SARS Coronavirus 2: NEGATIVE

## 2019-02-17 ENCOUNTER — Encounter: Payer: Self-pay | Admitting: Gastroenterology

## 2019-02-17 ENCOUNTER — Ambulatory Visit (AMBULATORY_SURGERY_CENTER): Payer: BC Managed Care – PPO | Admitting: Gastroenterology

## 2019-02-17 ENCOUNTER — Other Ambulatory Visit: Payer: Self-pay

## 2019-02-17 VITALS — BP 137/90 | HR 61 | Temp 97.7°F | Resp 20 | Ht 71.0 in | Wt 201.0 lb

## 2019-02-17 DIAGNOSIS — D125 Benign neoplasm of sigmoid colon: Secondary | ICD-10-CM | POA: Diagnosis not present

## 2019-02-17 DIAGNOSIS — D124 Benign neoplasm of descending colon: Secondary | ICD-10-CM

## 2019-02-17 DIAGNOSIS — Z86010 Personal history of colonic polyps: Secondary | ICD-10-CM

## 2019-02-17 DIAGNOSIS — Z1211 Encounter for screening for malignant neoplasm of colon: Secondary | ICD-10-CM | POA: Diagnosis not present

## 2019-02-17 MED ORDER — SODIUM CHLORIDE 0.9 % IV SOLN: 500.0000 mL | Freq: Once | INTRAVENOUS | Status: DC

## 2019-02-17 MED ORDER — SODIUM CHLORIDE 0.9 % IV SOLN
500.0000 mL | Freq: Once | INTRAVENOUS | Status: DC
Start: 2019-02-17 — End: 2019-02-17

## 2019-02-17 NOTE — Progress Notes (Signed)
Called to room to assist during endoscopic procedure.  Patient ID and intended procedure confirmed with present staff. Received instructions for my participation in the procedure from the performing physician.  

## 2019-02-17 NOTE — Patient Instructions (Signed)
HANDOUTS PROVIDED ON: POLYPS & DIVERTICULOSIS  The polyps removed taken today have been sent for pathology.  The results can take 1-3 weeks to receive.  When your next colonoscopy should occur will be based on the pathology results.    You may resume your previous diet and medication schedule.  Thank you for allowing [place] to care for you today!!!  YOU HAD AN ENDOSCOPIC PROCEDURE TODAY AT [facility]:   Refer to the procedure report that was given to you for any specific questions about what was found during the examination.  If the procedure report does not answer your questions, please call your gastroenterologist to clarify.  If you requested that your care partner not be given the details of your procedure findings, then the procedure report has been included in a sealed envelope for you to review at your convenience later.  YOU SHOULD EXPECT: Some feelings of bloating in the abdomen. Passage of more gas than usual.  Walking can help get rid of the air that was put into your GI tract during the procedure and reduce the bloating. If you had a lower endoscopy (such as a colonoscopy or flexible sigmoidoscopy) you may notice spotting of blood in your stool or on the toilet paper. If you underwent a bowel prep for your procedure, you may not have a normal bowel movement for a few days.  Please Note:  You might notice some irritation and congestion in your nose or some drainage.  This is from the oxygen used during your procedure.  There is no need for concern and it should clear up in a day or so.  SYMPTOMS TO REPORT IMMEDIATELY:   Following lower endoscopy (colonoscopy or flexible sigmoidoscopy):  Excessive amounts of blood in the stool  Significant tenderness or worsening of abdominal pains  Swelling of the abdomen that is new, acute  Fever of 100F or higher  For urgent or emergent issues, a gastroenterologist can be reached at any hour by calling [phone].   DIET:  We  do recommend a small meal at first, but then you may proceed to your regular diet.  Drink plenty of fluids but you should avoid alcoholic beverages for 24 hours.  ACTIVITY:  You should plan to take it easy for the rest of today and you should NOT DRIVE or use heavy machinery until tomorrow (because of the sedation medicines used during the test).    FOLLOW UP: Our staff will call the number listed on your records 48-72 hours following your procedure to check on you and address any questions or concerns that you may have regarding the information given to you following your procedure. If we do not reach you, we will leave a message.  We will attempt to reach you two times.  During this call, we will ask if you have developed any symptoms of COVID 19. If you develop any symptoms (ie: fever, flu-like symptoms, shortness of breath, cough etc.) before then, please call [phone].  If you test positive for Covid 19 in the 2 weeks post procedure, please call and report this information to [place].    If any biopsies were taken you will be contacted by phone or by letter within the next 1-3 weeks.  Please call us at [phone] if you have not heard about the biopsies in 3 weeks.    SIGNATURES/CONFIDENTIALITY: You and/or your care partner have signed paperwork which will be entered into your electronic medical record.  These signatures  attest to the fact that that the information above on your After Visit Summary has been reviewed and is understood.  Full responsibility of the confidentiality of this discharge information lies with you and/or your care-partner.

## 2019-02-17 NOTE — Progress Notes (Signed)
Pt's states no medical or surgical changes since previsit or office visit. 

## 2019-02-17 NOTE — Progress Notes (Signed)
To PACU, VSS. Report to [email or web address].tb 

## 2019-02-17 NOTE — Op Note (Signed)
[facility] Patient Name: Jason Duarte Procedure Date: 02/17/2019 10:15 AM MRN: [id number] Endoscopist: Milus Banister , MD Age: 58 Referring MD:  Date of Birth: 1960-06-11 Gender: Male Account #: [id number] Procedure:                Colonoscopy Indications:              High risk colon cancer surveillance: Personal                            history of colonic polyps; Colonoscopy 03/2018                            single subCM adenoma removed. Medicines:                Monitored Anesthesia Care Procedure:                Pre-Anesthesia Assessment:                           - Prior to the procedure, a History and Physical                            was performed, and patient medications and                            allergies were reviewed. The patient's tolerance of                            previous anesthesia was also reviewed. The risks                            and benefits of the procedure and the sedation                            options and risks were discussed with the patient.                            All questions were answered, and informed consent                            was obtained. Prior Anticoagulants: The patient has                            taken no previous anticoagulant or antiplatelet                            agents. ASA Grade Assessment: II - A patient with                            mild systemic disease. After reviewing the risks                            and benefits, the patient was deemed in  satisfactory condition to undergo the procedure.                           After obtaining informed consent, the colonoscope                            was passed under direct vision. Throughout the                            procedure, the patient's blood pressure, pulse, and                            oxygen saturations were monitored continuously. The                            Colonoscope was introduced through  the anus and                            advanced to the the cecum, identified by                            appendiceal orifice and ileocecal valve. The                            colonoscopy was performed without difficulty. The                            patient tolerated the procedure well. The quality                            of the bowel preparation was good. The ileocecal                            valve, appendiceal orifice, and rectum were                            photographed. Scope In: 10:17:26 AM Scope Out: 10:33:01 AM Scope Withdrawal Time: 0 hours 13 minutes 39 seconds  Total Procedure Duration: 0 hours 15 minutes 35 seconds  Findings:                 Three sessile polyps were found in the sigmoid                            colon and descending colon. The polyps were 3 to 4                            mm in size. These polyps were removed with a cold                            snare. Resection and retrieval were complete.                           Multiple small and large-mouthed diverticula were  found in the left colon.                           The exam was otherwise without abnormality on                            direct and retroflexion views. Complications:            No immediate complications. Estimated blood loss:                            None. Estimated Blood Loss:     Estimated blood loss: none. Impression:               - Three 3 to 4 mm polyps in the sigmoid colon and                            in the descending colon, removed with a cold snare.                            Resected and retrieved.                           - Diverticulosis in the left colon.                           - The examination was otherwise normal on direct                            and retroflexion views. Recommendation:           - Patient has a contact number available for                            emergencies. The signs and symptoms of potential                             delayed complications were discussed with the                            patient. Return to normal activities tomorrow.                            Written discharge instructions were provided to the                            patient.                           - Resume previous diet.                           - Continue present medications.                           - Await pathology results. Milus Banister, MD 02/17/2019 10:35:36 AM This report has been signed electronically.

## 2019-02-22 ENCOUNTER — Telehealth: Payer: Self-pay | Admitting: *Deleted

## 2019-02-22 ENCOUNTER — Telehealth: Payer: Self-pay

## 2019-02-22 NOTE — Telephone Encounter (Signed)
No answer on 2nd follow up call.

## 2019-02-22 NOTE — Telephone Encounter (Signed)
Unable to leave message on f/u call- mailbox full

## 2019-03-01 ENCOUNTER — Encounter: Payer: Self-pay | Admitting: Gastroenterology

## 2019-03-19 DIAGNOSIS — E7849 Other hyperlipidemia: Secondary | ICD-10-CM | POA: Diagnosis not present

## 2019-03-19 DIAGNOSIS — Z125 Encounter for screening for malignant neoplasm of prostate: Secondary | ICD-10-CM | POA: Diagnosis not present

## 2019-03-19 DIAGNOSIS — Z0000 Encounter for general adult medical examination without abnormal findings: Secondary | ICD-10-CM | POA: Diagnosis not present

## 2019-03-19 DIAGNOSIS — R5383 Other fatigue: Secondary | ICD-10-CM | POA: Diagnosis not present

## 2019-03-26 DIAGNOSIS — N5082 Scrotal pain: Secondary | ICD-10-CM | POA: Diagnosis not present

## 2019-03-26 DIAGNOSIS — Z23 Encounter for immunization: Secondary | ICD-10-CM | POA: Diagnosis not present

## 2019-03-26 DIAGNOSIS — K635 Polyp of colon: Secondary | ICD-10-CM | POA: Diagnosis not present

## 2019-03-26 DIAGNOSIS — H43399 Other vitreous opacities, unspecified eye: Secondary | ICD-10-CM | POA: Diagnosis not present

## 2019-03-26 DIAGNOSIS — R1031 Right lower quadrant pain: Secondary | ICD-10-CM | POA: Diagnosis not present

## 2019-03-26 DIAGNOSIS — Z0000 Encounter for general adult medical examination without abnormal findings: Secondary | ICD-10-CM | POA: Diagnosis not present

## 2019-05-25 DIAGNOSIS — G4733 Obstructive sleep apnea (adult) (pediatric): Secondary | ICD-10-CM | POA: Diagnosis not present

## 2019-06-24 DIAGNOSIS — Z20828 Contact with and (suspected) exposure to other viral communicable diseases: Secondary | ICD-10-CM | POA: Diagnosis not present

## 2019-06-24 DIAGNOSIS — Z03818 Encounter for observation for suspected exposure to other biological agents ruled out: Secondary | ICD-10-CM | POA: Diagnosis not present

## 2019-07-12 ENCOUNTER — Ambulatory Visit (HOSPITAL_COMMUNITY): Payer: Self-pay | Admitting: Surgery

## 2019-07-12 DIAGNOSIS — G4733 Obstructive sleep apnea (adult) (pediatric): Secondary | ICD-10-CM | POA: Diagnosis not present

## 2019-07-12 DIAGNOSIS — K4090 Unilateral inguinal hernia, without obstruction or gangrene, not specified as recurrent: Secondary | ICD-10-CM | POA: Diagnosis not present

## 2019-07-12 DIAGNOSIS — R1032 Left lower quadrant pain: Secondary | ICD-10-CM | POA: Diagnosis not present

## 2019-07-12 DIAGNOSIS — M6208 Separation of muscle (nontraumatic), other site: Secondary | ICD-10-CM | POA: Diagnosis not present

## 2019-07-12 NOTE — H&P (View-Only) (Signed)
Jason Duarte Appointment: 07/12/2019 9:30 AM Location: [facility] Patient #: [id number] DOB: 12-13-1960 Married / Language: Jason Duarte / Race: White Male  History of Present Illness [name] MD; 07/12/2019 10:07 AM) The patient is a 59 year old male who presents with an inguinal hernia. Note for "Inguinal hernia": ` ` ` Patient sent for surgical consultation at the request of Dr Virgina Jock  Chief Complaint: Worsening groin pain and right groin bulging suspicious for hernias ` ` The patient is a pleasant active male. He's had some intermittent groin pain for the past few years. Has had ultrasound with question hydroceles but nothing definite. Had more sharp episode when he was doing yard work several months ago. [facility]. No major testicular prostate she is according to the patient. Records not available. Concern for some weakness in the groins but no definite hernia. However the past 2 months he's noticed something popping out intermittently and now out all the time. Hernia more strongly suspected and surgical consultation offered. Still gets some intermittent left groin pain. Bulging above his belly button and wonders if that is an issue. He normally moves his bowels at least once a day. Some irregularity in caliber and frequency but nothing to major. Usually does a 2.5 mile walk without much difficulty. With the hernia out, he had more intermittent groin discomfort. He's been wearing a truss which seems to help keep it reduced and make it more tolerable most of the time. Used to smoke but has not used tobacco and 14 years. He does have sleep apnea but wears CPAP at night. Had an underwent colonoscopy by Dr. Ardis Hughs this year as well  (Review of systems as stated in this history (HPI) or in the review of systems. Otherwise all other 12 point ROS are negative) ` ` `  This patient encounter took 35 minutes today to perform the following: obtain  history, perform exam, review outside records, interpret tests & imaging, counsel the patient on their diagnosis; and, document this encounter, including findings & plan in the electronic health record (EHR).   Past Surgical History Antonietta Jewel, [place]; 07/12/2019 9:35 AM) Spinal Surgery - Lower Back  Diagnostic Studies History (Chanel Teressa Senter, CMA; 07/12/2019 9:35 AM) Colonoscopy within last year  Allergies (Chanel Teressa Senter, CMA; 07/12/2019 9:35 AM) No Known Drug Allergies [07/12/2019]: Allergies Reconciled  Medication History (Chanel Teressa Senter, CMA; 07/12/2019 9:35 AM) Diclofenac Sodium (75MG  Tablet DR, Oral) Active. Rosuvastatin Calcium (10MG  Tablet, Oral) Active. Medications Reconciled  Social History Antonietta Jewel, CMA; 07/12/2019 9:35 AM) Alcohol use Moderate alcohol use. Caffeine use Coffee. Illicit drug use Remotely quit drug use. Tobacco use Former smoker.  Family History Antonietta Jewel, [place]; 07/12/2019 9:35 AM) Arthritis Mother. Cancer Family Members In General. Hypertension Father. Migraine Headache Mother. Prostate Cancer Father. Respiratory Condition Family Members In General.  Other Problems Antonietta Jewel, [place]; 07/12/2019 9:35 AM) Arthritis Back Pain Hypercholesterolemia Sleep Apnea     Review of Systems (Chanel Nolan CMA; 07/12/2019 9:35 AM) General Present- Appetite Loss and Fatigue. Not Present- Chills, Fever, Night Sweats, Weight Gain and Weight Loss. Skin Not Present- Change in Wart/Mole, Dryness, Hives, Jaundice, New Lesions, Non-Healing Wounds, Rash and Ulcer. HEENT Not Present- Earache, Hearing Loss, Hoarseness, Nose Bleed, Oral Ulcers, Ringing in the Ears, Seasonal Allergies, Sinus Pain, Sore Throat, Visual Disturbances, Wears glasses/contact lenses and Yellow Eyes. Respiratory Present- Snoring. Not Present- Bloody sputum, Chronic Cough, Difficulty Breathing and Wheezing. Breast Not Present- Breast Mass, Breast Pain, Nipple Discharge and Skin  Changes.  Cardiovascular Present- Leg Cramps. Not Present- Chest Pain, Difficulty Breathing Lying Down, Palpitations, Rapid Heart Rate, Shortness of Breath and Swelling of Extremities. Gastrointestinal Present- Abdominal Pain, Bloating, Change in Bowel Habits, Constipation, Gets full quickly at meals, Indigestion and Nausea. Not Present- Bloody Stool, Chronic diarrhea, Difficulty Swallowing, Excessive gas, Hemorrhoids, Rectal Pain and Vomiting. Musculoskeletal Present- Back Pain, Joint Pain, Joint Stiffness and Swelling of Extremities. Not Present- Muscle Pain and Muscle Weakness. Neurological Not Present- Decreased Memory, Fainting, Headaches, Numbness, Seizures, Tingling, Tremor, Trouble walking and Weakness. Psychiatric Not Present- Anxiety, Bipolar, Change in Sleep Pattern, Depression, Fearful and Frequent crying. Endocrine Not Present- Cold Intolerance, Excessive Hunger, Hair Changes, Heat Intolerance, Hot flashes and New Diabetes. Hematology Not Present- Blood Thinners, Easy Bruising, Excessive bleeding, Gland problems, HIV and Persistent Infections.  Vitals (Chanel Nolan CMA; 07/12/2019 9:36 AM) 07/12/2019 9:35 AM Weight: 206.25 lb Height: 71in Body Surface Area: 2.14 m Body Mass Index: 28.77 kg/m  Temp.: 97.87F  Pulse: 68 (Regular)  BP: 128/80(Sitting, Left Arm, Standard)        Physical Exam [name] MD; 07/12/2019 10:01 AM)  General Mental Status-Alert. General Appearance-Not in acute distress, Not Sickly. Orientation-Oriented X3. Hydration-Well hydrated. Voice-Normal.  Integumentary Global Assessment Upon inspection and palpation of skin surfaces of the - Axillae: non-tender, no inflammation or ulceration, no drainage. and Distribution of scalp and body hair is normal. General Characteristics Temperature - normal warmth is noted.  Head and Neck Head-normocephalic, atraumatic with no lesions or palpable masses. Face Global Assessment -  atraumatic, no absence of expression. Neck Global Assessment - no abnormal movements, no bruit auscultated on the right, no bruit auscultated on the left, no decreased range of motion, non-tender. Trachea-midline. Thyroid Gland Characteristics - non-tender.  Eye Eyeball - Left-Extraocular movements intact, No Nystagmus - Left. Eyeball - Right-Extraocular movements intact, No Nystagmus - Right. Cornea - Left-No Hazy - Left. Cornea - Right-No Hazy - Right. Sclera/Conjunctiva - Left-No scleral icterus, No Discharge - Left. Sclera/Conjunctiva - Right-No scleral icterus, No Discharge - Right. Pupil - Left-Direct reaction to light normal. Pupil - Right-Direct reaction to light normal.  ENMT Ears Pinna - Left - no drainage observed, no generalized tenderness observed. Pinna - Right - no drainage observed, no generalized tenderness observed. Nose and Sinuses External Inspection of the Nose - no destructive lesion observed. Inspection of the nares - Left - quiet respiration. Inspection of the nares - Right - quiet respiration. Mouth and Throat Lips - Upper Lip - no fissures observed, no pallor noted. Lower Lip - no fissures observed, no pallor noted. Nasopharynx - no discharge present. Oral Cavity/Oropharynx - Tongue - no dryness observed. Oral Mucosa - no cyanosis observed. Hypopharynx - no evidence of airway distress observed.  Chest and Lung Exam Inspection Movements - Normal and Symmetrical. Accessory muscles - No use of accessory muscles in breathing. Palpation Palpation of the chest reveals - Non-tender. Auscultation Breath sounds - Normal and Clear.  Cardiovascular Auscultation Rhythm - Regular. Murmurs & Other Heart Sounds - Auscultation of the heart reveals - No Murmurs and No Systolic Clicks.  Abdomen Inspection Inspection of the abdomen reveals - No Visible peristalsis and No Abnormal pulsations. Umbilicus - No Bleeding, No Urine  drainage. Palpation/Percussion Palpation and Percussion of the abdomen reveal - Soft, Non Tender, No Rebound tenderness, No Rigidity (guarding) and No Cutaneous hyperesthesia. Note: Abdomen overweight but soft. Nontender. Not distended. Moderate suprapubic umbilical diastases recti. Very small impulse at the base of the umbilical stump. No umbilical or  incisional hernias. No guarding.  Male Genitourinary Sexual Maturity Tanner 5 - Adult hair pattern and Adult penile size and shape. Note: Small but definite right inguinal hernia. Sensitive the reducible. Impulse on left side on Valsalva suspicious for possible contralateral left inguinal hernias well. Otherwise normal external male genitalia.  Peripheral Vascular Upper Extremity Inspection - Left - No Cyanotic nailbeds - Left, Not Ischemic. Inspection - Right - No Cyanotic nailbeds - Right, Not Ischemic.  Neurologic Neurologic evaluation reveals -normal attention span and ability to concentrate, able to name objects and repeat phrases. Appropriate fund of knowledge , normal sensation and normal coordination. Mental Status Affect - not angry, not paranoid. Cranial Nerves-Normal Bilaterally. Gait-Normal.  Neuropsychiatric Mental status exam performed with findings of-able to articulate well with normal speech/language, rate, volume and coherence, thought content normal with ability to perform basic computations and apply abstract reasoning and no evidence of hallucinations, delusions, obsessions or homicidal/suicidal ideation.  Musculoskeletal Global Assessment Spine, Ribs and Pelvis - no instability, subluxation or laxity. Right Upper Extremity - no instability, subluxation or laxity.  Lymphatic Head & Neck  General Head & Neck Lymphatics: Bilateral - Description - No Localized lymphadenopathy. Axillary  General Axillary Region: Bilateral - Description - No Localized lymphadenopathy. Femoral & Inguinal  Generalized  Femoral & Inguinal Lymphatics: Left - Description - No Localized lymphadenopathy. Right - Description - No Localized lymphadenopathy.    Assessment & Plan [name] MD; 07/12/2019 10:05 AM)  RIGHT INGUINAL HERNIA (K40.90) Impression: Classic story of intermittent groin pain now with bulging and obvious right inguinal hernia to groin. Reducible. Discomfort impulse on left side suspicious for small contralateral left inguinal hernia as well.  I think he would benefit from laparoscopic exploration and repair of hernias found. He is interested in proceeding. He is hoping to try and get this done before he goes to vacation in late July. We will see if we can get it done so that he has at least 4-6 weeks of recovery before going to the beach  Current Plans You are being scheduled for surgery- Our schedulers will call you.  You should hear from our office's scheduling department within 5 working days about the location, date, and time of surgery. We try to make accommodations for patient's preferences in scheduling surgery, but sometimes the OR schedule or the surgeon's schedule prevents [place] from making those accommodations.  If you have not heard from our office [phone]) in 5 working days, call the office and ask for your surgeon's nurse.  If you have other questions about your diagnosis, plan, or surgery, call the office and ask for your surgeon's nurse.   GROIN PAIN, LEFT (R10.32) Impression: Intermittent groin pain on the left side as well. Suspicious for possible hernia repair. We will do laparoscopic expiration and fix if there   PREOP - ING HERNIA - ENCOUNTER FOR PREOPERATIVE EXAMINATION FOR GENERAL SURGICAL PROCEDURE (Z01.818)  Current Plans Written instructions provided The anatomy & physiology of the abdominal wall and pelvic floor was discussed. The pathophysiology of hernias in the inguinal and pelvic region was discussed. Natural history risks such as progressive  enlargement, pain, incarceration, and strangulation was discussed. Contributors to complications such as smoking, obesity, diabetes, prior surgery, etc were discussed.  I feel the risks of no intervention will lead to serious problems that outweigh the operative risks; therefore, I recommended surgery to reduce and repair the hernia. I explained laparoscopic techniques with possible need for an open approach. I noted usual use of  mesh to patch and/or buttress hernia repair  Risks such as bleeding, infection, abscess, need for further treatment, heart attack, death, and other risks were discussed. I noted a good likelihood this will help address the problem. Goals of post-operative recovery were discussed as well. Possibility that this will not correct all symptoms was explained. I stressed the importance of low-impact activity, aggressive pain control, avoiding constipation, & not pushing through pain to minimize risk of post-operative chronic pain or injury. Possibility of reherniation was discussed. We will work to minimize complications.  An educational handout further explaining the pathology & treatment options was given as well. Questions were answered. The patient expresses understanding & wishes to proceed with surgery.  Pt Education - Pamphlet Given - Laparoscopic Hernia Repair: discussed with patient and provided information. Pt Education - [facility] Pain Control (Robson Trickey) Pt Education - [facility] Hernia Post-Op HCI (Carnell Casamento): discussed with patient and provided information. Pt Education - [facility] Mesh education: discussed with patient and provided information.  DIASTASIS RECTI (M62.08)  Current Plans Pt Education - [facility] Diastasis Recti: discussed with patient and provided information.  OSA ON CPAP (G47.33)  [name], MD, FACS, MASCRS Gastrointestinal and Minimally Invasive Surgery  [facility] [address], [place], [place] [address] [phone]  Fax [phone] Main/Paging  CONTACT INFORMATION: Weekday (9AM-5PM) concerns: Call [facility] main office at [phone] Weeknight (5PM-9AM) or Weekend/Holiday concerns: Check [email or web address] for General Surgery [facility] coverage (Please, do not use SecureChat as it is not reliable communication to surgeons for patient care)

## 2019-07-12 NOTE — H&P (Signed)
Jason Duarte Appointment: 07/12/2019 9:30 AM Location: [facility] Patient #: [id number] DOB: 24-Mar-1960 Married / Language: Cleophus Molt / Race: White Male  History of Present Illness [name] MD; 07/12/2019 10:07 AM) The patient is a 59 year old male who presents with an inguinal hernia. Note for "Inguinal hernia": ` ` ` Patient sent for surgical consultation at the request of Dr Virgina Jock  Chief Complaint: Worsening groin pain and right groin bulging suspicious for hernias ` ` The patient is a pleasant active male. He's had some intermittent groin pain for the past few years. Has had ultrasound with question hydroceles but nothing definite. Had more sharp episode when he was doing yard work several months ago. [facility]. No major testicular prostate she is according to the patient. Records not available. Concern for some weakness in the groins but no definite hernia. However the past 2 months he's noticed something popping out intermittently and now out all the time. Hernia more strongly suspected and surgical consultation offered. Still gets some intermittent left groin pain. Bulging above his belly button and wonders if that is an issue. He normally moves his bowels at least once a day. Some irregularity in caliber and frequency but nothing to major. Usually does a 2.5 mile walk without much difficulty. With the hernia out, he had more intermittent groin discomfort. He's been wearing a truss which seems to help keep it reduced and make it more tolerable most of the time. Used to smoke but has not used tobacco and 14 years. He does have sleep apnea but wears CPAP at night. Had an underwent colonoscopy by Dr. Ardis Hughs this year as well  (Review of systems as stated in this history (HPI) or in the review of systems. Otherwise all other 12 point ROS are negative) ` ` `  This patient encounter took 35 minutes today to perform the following: obtain  history, perform exam, review outside records, interpret tests & imaging, counsel the patient on their diagnosis; and, document this encounter, including findings & plan in the electronic health record (EHR).   Past Surgical History Antonietta Jewel, [place]; 07/12/2019 9:35 AM) Spinal Surgery - Lower Back  Diagnostic Studies History (Chanel Teressa Senter, CMA; 07/12/2019 9:35 AM) Colonoscopy within last year  Allergies (Chanel Teressa Senter, CMA; 07/12/2019 9:35 AM) No Known Drug Allergies [07/12/2019]: Allergies Reconciled  Medication History (Chanel Teressa Senter, CMA; 07/12/2019 9:35 AM) Diclofenac Sodium (75MG  Tablet DR, Oral) Active. Rosuvastatin Calcium (10MG  Tablet, Oral) Active. Medications Reconciled  Social History Antonietta Jewel, CMA; 07/12/2019 9:35 AM) Alcohol use Moderate alcohol use. Caffeine use Coffee. Illicit drug use Remotely quit drug use. Tobacco use Former smoker.  Family History Antonietta Jewel, [place]; 07/12/2019 9:35 AM) Arthritis Mother. Cancer Family Members In General. Hypertension Father. Migraine Headache Mother. Prostate Cancer Father. Respiratory Condition Family Members In General.  Other Problems Antonietta Jewel, [place]; 07/12/2019 9:35 AM) Arthritis Back Pain Hypercholesterolemia Sleep Apnea     Review of Systems (Chanel Nolan CMA; 07/12/2019 9:35 AM) General Present- Appetite Loss and Fatigue. Not Present- Chills, Fever, Night Sweats, Weight Gain and Weight Loss. Skin Not Present- Change in Wart/Mole, Dryness, Hives, Jaundice, New Lesions, Non-Healing Wounds, Rash and Ulcer. HEENT Not Present- Earache, Hearing Loss, Hoarseness, Nose Bleed, Oral Ulcers, Ringing in the Ears, Seasonal Allergies, Sinus Pain, Sore Throat, Visual Disturbances, Wears glasses/contact lenses and Yellow Eyes. Respiratory Present- Snoring. Not Present- Bloody sputum, Chronic Cough, Difficulty Breathing and Wheezing. Breast Not Present- Breast Mass, Breast Pain, Nipple Discharge and Skin  Changes.  Cardiovascular Present- Leg Cramps. Not Present- Chest Pain, Difficulty Breathing Lying Down, Palpitations, Rapid Heart Rate, Shortness of Breath and Swelling of Extremities. Gastrointestinal Present- Abdominal Pain, Bloating, Change in Bowel Habits, Constipation, Gets full quickly at meals, Indigestion and Nausea. Not Present- Bloody Stool, Chronic diarrhea, Difficulty Swallowing, Excessive gas, Hemorrhoids, Rectal Pain and Vomiting. Musculoskeletal Present- Back Pain, Joint Pain, Joint Stiffness and Swelling of Extremities. Not Present- Muscle Pain and Muscle Weakness. Neurological Not Present- Decreased Memory, Fainting, Headaches, Numbness, Seizures, Tingling, Tremor, Trouble walking and Weakness. Psychiatric Not Present- Anxiety, Bipolar, Change in Sleep Pattern, Depression, Fearful and Frequent crying. Endocrine Not Present- Cold Intolerance, Excessive Hunger, Hair Changes, Heat Intolerance, Hot flashes and New Diabetes. Hematology Not Present- Blood Thinners, Easy Bruising, Excessive bleeding, Gland problems, HIV and Persistent Infections.  Vitals (Chanel Nolan CMA; 07/12/2019 9:36 AM) 07/12/2019 9:35 AM Weight: 206.25 lb Height: 71in Body Surface Area: 2.14 m Body Mass Index: 28.77 kg/m  Temp.: 97.21F  Pulse: 68 (Regular)  BP: 128/80(Sitting, Left Arm, Standard)        Physical Exam [name] MD; 07/12/2019 10:01 AM)  General Mental Status-Alert. General Appearance-Not in acute distress, Not Sickly. Orientation-Oriented X3. Hydration-Well hydrated. Voice-Normal.  Integumentary Global Assessment Upon inspection and palpation of skin surfaces of the - Axillae: non-tender, no inflammation or ulceration, no drainage. and Distribution of scalp and body hair is normal. General Characteristics Temperature - normal warmth is noted.  Head and Neck Head-normocephalic, atraumatic with no lesions or palpable masses. Face Global Assessment -  atraumatic, no absence of expression. Neck Global Assessment - no abnormal movements, no bruit auscultated on the right, no bruit auscultated on the left, no decreased range of motion, non-tender. Trachea-midline. Thyroid Gland Characteristics - non-tender.  Eye Eyeball - Left-Extraocular movements intact, No Nystagmus - Left. Eyeball - Right-Extraocular movements intact, No Nystagmus - Right. Cornea - Left-No Hazy - Left. Cornea - Right-No Hazy - Right. Sclera/Conjunctiva - Left-No scleral icterus, No Discharge - Left. Sclera/Conjunctiva - Right-No scleral icterus, No Discharge - Right. Pupil - Left-Direct reaction to light normal. Pupil - Right-Direct reaction to light normal.  ENMT Ears Pinna - Left - no drainage observed, no generalized tenderness observed. Pinna - Right - no drainage observed, no generalized tenderness observed. Nose and Sinuses External Inspection of the Nose - no destructive lesion observed. Inspection of the nares - Left - quiet respiration. Inspection of the nares - Right - quiet respiration. Mouth and Throat Lips - Upper Lip - no fissures observed, no pallor noted. Lower Lip - no fissures observed, no pallor noted. Nasopharynx - no discharge present. Oral Cavity/Oropharynx - Tongue - no dryness observed. Oral Mucosa - no cyanosis observed. Hypopharynx - no evidence of airway distress observed.  Chest and Lung Exam Inspection Movements - Normal and Symmetrical. Accessory muscles - No use of accessory muscles in breathing. Palpation Palpation of the chest reveals - Non-tender. Auscultation Breath sounds - Normal and Clear.  Cardiovascular Auscultation Rhythm - Regular. Murmurs & Other Heart Sounds - Auscultation of the heart reveals - No Murmurs and No Systolic Clicks.  Abdomen Inspection Inspection of the abdomen reveals - No Visible peristalsis and No Abnormal pulsations. Umbilicus - No Bleeding, No Urine  drainage. Palpation/Percussion Palpation and Percussion of the abdomen reveal - Soft, Non Tender, No Rebound tenderness, No Rigidity (guarding) and No Cutaneous hyperesthesia. Note: Abdomen overweight but soft. Nontender. Not distended. Moderate suprapubic umbilical diastases recti. Very small impulse at the base of the umbilical stump. No umbilical or  incisional hernias. No guarding.  Male Genitourinary Sexual Maturity Tanner 5 - Adult hair pattern and Adult penile size and shape. Note: Small but definite right inguinal hernia. Sensitive the reducible. Impulse on left side on Valsalva suspicious for possible contralateral left inguinal hernias well. Otherwise normal external male genitalia.  Peripheral Vascular Upper Extremity Inspection - Left - No Cyanotic nailbeds - Left, Not Ischemic. Inspection - Right - No Cyanotic nailbeds - Right, Not Ischemic.  Neurologic Neurologic evaluation reveals -normal attention span and ability to concentrate, able to name objects and repeat phrases. Appropriate fund of knowledge , normal sensation and normal coordination. Mental Status Affect - not angry, not paranoid. Cranial Nerves-Normal Bilaterally. Gait-Normal.  Neuropsychiatric Mental status exam performed with findings of-able to articulate well with normal speech/language, rate, volume and coherence, thought content normal with ability to perform basic computations and apply abstract reasoning and no evidence of hallucinations, delusions, obsessions or homicidal/suicidal ideation.  Musculoskeletal Global Assessment Spine, Ribs and Pelvis - no instability, subluxation or laxity. Right Upper Extremity - no instability, subluxation or laxity.  Lymphatic Head & Neck  General Head & Neck Lymphatics: Bilateral - Description - No Localized lymphadenopathy. Axillary  General Axillary Region: Bilateral - Description - No Localized lymphadenopathy. Femoral & Inguinal  Generalized  Femoral & Inguinal Lymphatics: Left - Description - No Localized lymphadenopathy. Right - Description - No Localized lymphadenopathy.    Assessment & Plan [name] MD; 07/12/2019 10:05 AM)  RIGHT INGUINAL HERNIA (K40.90) Impression: Classic story of intermittent groin pain now with bulging and obvious right inguinal hernia to groin. Reducible. Discomfort impulse on left side suspicious for small contralateral left inguinal hernia as well.  I think he would benefit from laparoscopic exploration and repair of hernias found. He is interested in proceeding. He is hoping to try and get this done before he goes to vacation in late July. We will see if we can get it done so that he has at least 4-6 weeks of recovery before going to the beach  Current Plans You are being scheduled for surgery- Our schedulers will call you.  You should hear from our office's scheduling department within 5 working days about the location, date, and time of surgery. We try to make accommodations for patient's preferences in scheduling surgery, but sometimes the OR schedule or the surgeon's schedule prevents [place] from making those accommodations.  If you have not heard from our office [phone]) in 5 working days, call the office and ask for your surgeon's nurse.  If you have other questions about your diagnosis, plan, or surgery, call the office and ask for your surgeon's nurse.   GROIN PAIN, LEFT (R10.32) Impression: Intermittent groin pain on the left side as well. Suspicious for possible hernia repair. We will do laparoscopic expiration and fix if there   PREOP - ING HERNIA - ENCOUNTER FOR PREOPERATIVE EXAMINATION FOR GENERAL SURGICAL PROCEDURE (Z01.818)  Current Plans Written instructions provided The anatomy & physiology of the abdominal wall and pelvic floor was discussed. The pathophysiology of hernias in the inguinal and pelvic region was discussed. Natural history risks such as progressive  enlargement, pain, incarceration, and strangulation was discussed. Contributors to complications such as smoking, obesity, diabetes, prior surgery, etc were discussed.  I feel the risks of no intervention will lead to serious problems that outweigh the operative risks; therefore, I recommended surgery to reduce and repair the hernia. I explained laparoscopic techniques with possible need for an open approach. I noted usual use of  mesh to patch and/or buttress hernia repair  Risks such as bleeding, infection, abscess, need for further treatment, heart attack, death, and other risks were discussed. I noted a good likelihood this will help address the problem. Goals of post-operative recovery were discussed as well. Possibility that this will not correct all symptoms was explained. I stressed the importance of low-impact activity, aggressive pain control, avoiding constipation, & not pushing through pain to minimize risk of post-operative chronic pain or injury. Possibility of reherniation was discussed. We will work to minimize complications.  An educational handout further explaining the pathology & treatment options was given as well. Questions were answered. The patient expresses understanding & wishes to proceed with surgery.  Pt Education - Pamphlet Given - Laparoscopic Hernia Repair: discussed with patient and provided information. Pt Education - [facility] Pain Control (Tanay Misuraca) Pt Education - [facility] Hernia Post-Op HCI (Deb Loudin): discussed with patient and provided information. Pt Education - [facility] Mesh education: discussed with patient and provided information.  DIASTASIS RECTI (M62.08)  Current Plans Pt Education - [facility] Diastasis Recti: discussed with patient and provided information.  OSA ON CPAP (G47.33)  [name], MD, FACS, MASCRS Gastrointestinal and Minimally Invasive Surgery  [facility] [address], [place], [place] [address] [phone]  Fax [phone] Main/Paging  CONTACT INFORMATION: Weekday (9AM-5PM) concerns: Call [facility] main office at [phone] Weeknight (5PM-9AM) or Weekend/Holiday concerns: Check [email or web address] for General Surgery [facility] coverage (Please, do not use SecureChat as it is not reliable communication to surgeons for patient care)

## 2019-07-29 NOTE — Patient Instructions (Addendum)
DUE TO COVID-19 ONLY ONE VISITOR IS ALLOWED TO COME WITH YOU AND STAY IN THE WAITING ROOM ONLY DURING PRE OP AND PROCEDURE DAY OF SURGERY. THE 1 VISITOR MAY VISIT WITH YOU AFTER SURGERY IN YOUR PRIVATE ROOM DURING VISITING HOURS ONLY!  YOU NEED TO HAVE A COVID 19 TEST ON: 08/03/19@ 11:00 am , THIS TEST MUST BE DONE BEFORE SURGERY, COME  [address] , [address].  ([place]) ONCE YOUR COVID TEST IS COMPLETED, PLEASE BEGIN THE QUARANTINE INSTRUCTIONS AS OUTLINED IN YOUR HANDOUT.                Jason Duarte    Your procedure is scheduled on: 08/06/19   Report to [facility] Main  Entrance   Report to admitting at: 1:30 PM     Call this number if you have problems the morning of surgery [phone]    Remember:   NO SOLID FOOD AFTER MIDNIGHT THE NIGHT PRIOR TO SURGERY. NOTHING BY MOUTH EXCEPT CLEAR LIQUIDS UNTIL: 12:30 pm . PLEASE FINISH ENSURE DRINK PER SURGEON ORDER  WHICH NEEDS TO BE COMPLETED AT: 12:30 pm .   CLEAR LIQUID DIET   Foods Allowed                                                                     Foods Excluded  Coffee and tea, regular and decaf                             liquids that you cannot  Plain Jell-O any favor except red or purple                                           see through such as: Fruit ices (not with fruit pulp)                                     milk, soups, orange juice  Iced Popsicles                                    All solid food Carbonated beverages, regular and diet                                    Cranberry, grape and apple juices Sports drinks like Gatorade Lightly seasoned clear broth or consume(fat free) Sugar, honey syrup  Sample Menu Breakfast                                Lunch                                     Supper Cranberry juice  Beef broth                            Chicken broth Jell-O                                     Grape juice                            Apple juice Coffee or tea                        Jell-O                                      Popsicle                                                Coffee or tea                        Coffee or tea  _____________________________________________________________________   BRUSH YOUR TEETH MORNING OF SURGERY AND RINSE YOUR MOUTH OUT, NO CHEWING GUM CANDY OR MINTS.   PLEASE BRING CPAP MASK AND  TUBING ONLY. DEVICE WILL BE PROVIDED!                                 You may not have any metal on your body including hair pins and              piercings  Do not wear jewelry, lotions, powders or perfumes, deodorant             Men may shave face and neck.   Do not bring valuables to the hospital. [place].  Contacts, dentures or bridgework may not be worn into surgery.  Leave suitcase in the car. After surgery it may be brought to your room.     Patients discharged the day of surgery will not be allowed to drive home. IF YOU ARE HAVING SURGERY AND GOING HOME THE SAME DAY, YOU MUST HAVE AN ADULT TO DRIVE YOU HOME AND BE WITH YOU FOR 24 HOURS. YOU MAY GO HOME BY TAXI OR UBER OR ORTHERWISE, BUT AN ADULT MUST ACCOMPANY YOU HOME AND STAY WITH YOU FOR 24 HOURS.  Name and phone number of your driver:  Special Instructions: N/A              Please read over the following fact sheets you were given: _____________________________________________________________________  [facility] - Preparing for Surgery Before surgery, you can play an important role.  Because skin is not sterile, your skin needs to be as free of germs as possible.  You can reduce the number of germs on your skin by washing with CHG (chlorahexidine gluconate) soap before surgery.  CHG is an antiseptic cleaner which kills germs and bonds with the skin to continue killing germs even after washing. Please DO NOT use if  you have an allergy to CHG or antibacterial soaps.  If your skin  becomes reddened/irritated stop using the CHG and inform your nurse when you arrive at Short Stay. Do not shave (including legs and underarms) for at least 48 hours prior to the first CHG shower.  You may shave your face/neck. Please follow these instructions carefully:  1.  Shower with CHG Soap the night before surgery and the  morning of Surgery.  2.  If you choose to wash your hair, wash your hair first as usual with your  normal  shampoo.  3.  After you shampoo, rinse your hair and body thoroughly to remove the  shampoo.                           4.  Use CHG as you would any other liquid soap.  You can apply chg directly  to the skin and wash                       Gently with a scrungie or clean washcloth.  5.  Apply the CHG Soap to your body ONLY FROM THE NECK DOWN.   Do not use on face/ open                           Wound or open sores. Avoid contact with eyes, ears mouth and genitals (private parts).                       Wash face,  Genitals (private parts) with your normal soap.             6.  Wash thoroughly, paying special attention to the area where your surgery  will be performed.  7.  Thoroughly rinse your body with warm water from the neck down.  8.  DO NOT shower/wash with your normal soap after using and rinsing off  the CHG Soap.                9.  Pat yourself dry with a clean towel.            10.  Wear clean pajamas.            11.  Place clean sheets on your bed the night of your first shower and do not  sleep with pets. Day of Surgery : Do not apply any lotions/deodorants the morning of surgery.  Please wear clean clothes to the hospital/surgery center.  FAILURE TO FOLLOW THESE INSTRUCTIONS MAY RESULT IN THE CANCELLATION OF YOUR SURGERY PATIENT SIGNATURE_________________________________  NURSE SIGNATURE__________________________________  ________________________________________________________________________

## 2019-07-30 ENCOUNTER — Encounter (HOSPITAL_COMMUNITY)
Admission: RE | Admit: 2019-07-30 | Discharge: 2019-07-30 | Disposition: A | Discharge status: Home / Self Care | Payer: BC Managed Care – PPO | Source: Ambulatory Visit | Attending: Surgery | Admitting: Surgery

## 2019-07-30 ENCOUNTER — Encounter (HOSPITAL_COMMUNITY): Payer: Self-pay

## 2019-07-30 ENCOUNTER — Other Ambulatory Visit: Payer: Self-pay

## 2019-07-30 DIAGNOSIS — Z01812 Encounter for preprocedural laboratory examination: Secondary | ICD-10-CM | POA: Insufficient documentation

## 2019-07-30 HISTORY — DX: Sleep apnea, unspecified: G47.30

## 2019-07-30 LAB — CBC
HCT: 45.6 % (ref 39.0–52.0)
Hemoglobin: 15.2 g/dL (ref 13.0–17.0)
MCH: 31.5 pg (ref 26.0–34.0)
MCHC: 33.3 g/dL (ref 30.0–36.0)
MCV: 94.6 fL (ref 80.0–100.0)
Platelets: 167 10*3/uL (ref 150–400)
RBC: 4.82 MIL/uL (ref 4.22–5.81)
RDW: 11.9 % (ref 11.5–15.5)
WBC: 5.3 10*3/uL (ref 4.0–10.5)
nRBC: 0 % (ref 0.0–0.2)

## 2019-07-30 NOTE — Progress Notes (Signed)
COVID Vaccine Completed: yes Date COVID Vaccine completed:05/2019 COVID vaccine manufacturer: [employer]   PCP - Dr. Shon Baton. LOV: 03/01/19 Cardiologist -   Chest x-ray -  EKG -  Stress Test -  ECHO -  Cardiac Cath -   Sleep Study - yes CPAP - yes  Fasting Blood Sugar -  Checks Blood Sugar _____ times a day  Blood Thinner Instructions: Aspirin Instructions: Last Dose:  Anesthesia review:   Patient denies shortness of breath, fever, cough and chest pain at PAT appointment   Patient verbalized understanding of instructions that were given to them at the PAT appointment. Patient was also instructed that they will need to review over the PAT instructions again at home before surgery.

## 2019-08-03 ENCOUNTER — Other Ambulatory Visit (HOSPITAL_COMMUNITY)
Admission: RE | Admit: 2019-08-03 | Discharge: 2019-08-03 | Disposition: A | Discharge status: Home / Self Care | Payer: BC Managed Care – PPO | Source: Ambulatory Visit | Attending: Surgery | Admitting: Surgery

## 2019-08-03 DIAGNOSIS — Z01812 Encounter for preprocedural laboratory examination: Secondary | ICD-10-CM | POA: Diagnosis not present

## 2019-08-03 DIAGNOSIS — Z20822 Contact with and (suspected) exposure to covid-19: Secondary | ICD-10-CM | POA: Diagnosis not present

## 2019-08-03 LAB — SARS CORONAVIRUS 2 (TAT 6-24 HRS): SARS Coronavirus 2: NEGATIVE

## 2019-08-05 MED ORDER — BUPIVACAINE LIPOSOME 1.3 % IJ SUSP
20.0000 mL | Freq: Once | INTRAMUSCULAR | Status: DC
  Filled 2019-08-05: qty 20

## 2019-08-06 ENCOUNTER — Ambulatory Visit (HOSPITAL_COMMUNITY): Payer: BC Managed Care – PPO | Admitting: Anesthesiology

## 2019-08-06 ENCOUNTER — Encounter (HOSPITAL_COMMUNITY)
Admission: RE | Disposition: A | Discharge status: Home / Self Care | Payer: Self-pay | Source: Home / Self Care | Attending: Surgery

## 2019-08-06 ENCOUNTER — Other Ambulatory Visit: Payer: Self-pay

## 2019-08-06 ENCOUNTER — Ambulatory Visit (HOSPITAL_COMMUNITY)
Admission: RE | Admit: 2019-08-06 | Discharge: 2019-08-06 | Disposition: A | Discharge status: Home / Self Care | Payer: BC Managed Care – PPO | Attending: Surgery | Admitting: Surgery

## 2019-08-06 ENCOUNTER — Encounter (HOSPITAL_COMMUNITY): Payer: Self-pay | Admitting: Surgery

## 2019-08-06 DIAGNOSIS — E7800 Pure hypercholesterolemia, unspecified: Secondary | ICD-10-CM | POA: Insufficient documentation

## 2019-08-06 DIAGNOSIS — D176 Benign lipomatous neoplasm of spermatic cord: Secondary | ICD-10-CM | POA: Diagnosis not present

## 2019-08-06 DIAGNOSIS — Z79899 Other long term (current) drug therapy: Secondary | ICD-10-CM | POA: Insufficient documentation

## 2019-08-06 DIAGNOSIS — K4020 Bilateral inguinal hernia, without obstruction or gangrene, not specified as recurrent: Secondary | ICD-10-CM

## 2019-08-06 DIAGNOSIS — G4730 Sleep apnea, unspecified: Secondary | ICD-10-CM | POA: Diagnosis not present

## 2019-08-06 DIAGNOSIS — M6208 Separation of muscle (nontraumatic), other site: Secondary | ICD-10-CM | POA: Insufficient documentation

## 2019-08-06 DIAGNOSIS — M1990 Unspecified osteoarthritis, unspecified site: Secondary | ICD-10-CM | POA: Diagnosis not present

## 2019-08-06 DIAGNOSIS — E785 Hyperlipidemia, unspecified: Secondary | ICD-10-CM | POA: Insufficient documentation

## 2019-08-06 DIAGNOSIS — K4090 Unilateral inguinal hernia, without obstruction or gangrene, not specified as recurrent: Secondary | ICD-10-CM | POA: Diagnosis not present

## 2019-08-06 DIAGNOSIS — Z87891 Personal history of nicotine dependence: Secondary | ICD-10-CM | POA: Diagnosis not present

## 2019-08-06 DIAGNOSIS — G4733 Obstructive sleep apnea (adult) (pediatric): Secondary | ICD-10-CM | POA: Diagnosis not present

## 2019-08-06 DIAGNOSIS — Z791 Long term (current) use of non-steroidal anti-inflammatories (NSAID): Secondary | ICD-10-CM | POA: Diagnosis not present

## 2019-08-06 HISTORY — DX: Bilateral inguinal hernia, without obstruction or gangrene, not specified as recurrent: K40.20

## 2019-08-06 HISTORY — PX: INGUINAL HERNIA REPAIR: SHX194

## 2019-08-06 SURGERY — REPAIR, HERNIA, INGUINAL, LAPAROSCOPIC
Anesthesia: General | Laterality: Right

## 2019-08-06 MED ORDER — LACTATED RINGERS IV SOLN: INTRAVENOUS | Status: DC

## 2019-08-06 MED ORDER — OXYCODONE HCL 5 MG PO TABS
ORAL_TABLET | ORAL | Status: AC
  Filled 2019-08-06: qty 1

## 2019-08-06 MED ORDER — PROPOFOL 10 MG/ML IV BOLUS
INTRAVENOUS | Status: DC | PRN
  Administered 2019-08-06: 30 mg via INTRAVENOUS
  Administered 2019-08-06: 170 mg via INTRAVENOUS

## 2019-08-06 MED ORDER — ROCURONIUM BROMIDE 10 MG/ML (PF) SYRINGE
PREFILLED_SYRINGE | INTRAVENOUS | Status: AC
  Filled 2019-08-06: qty 10

## 2019-08-06 MED ORDER — CEFAZOLIN SODIUM-DEXTROSE 2-4 GM/100ML-% IV SOLN
2.0000 g | INTRAVENOUS | Status: AC
  Administered 2019-08-06: 2 g via INTRAVENOUS
  Filled 2019-08-06: qty 100

## 2019-08-06 MED ORDER — LABETALOL HCL 5 MG/ML IV SOLN
INTRAVENOUS | Status: AC
  Filled 2019-08-06: qty 4

## 2019-08-06 MED ORDER — TRAMADOL HCL 50 MG PO TABS: 50.0000 mg | ORAL_TABLET | Freq: Four times a day (QID) | ORAL | 0 refills | Status: DC | PRN

## 2019-08-06 MED ORDER — ENSURE PRE-SURGERY PO LIQD
296.0000 mL | Freq: Once | ORAL | Status: DC
  Filled 2019-08-06: qty 296

## 2019-08-06 MED ORDER — DEXAMETHASONE SODIUM PHOSPHATE 10 MG/ML IJ SOLN
INTRAMUSCULAR | Status: AC
  Filled 2019-08-06: qty 1

## 2019-08-06 MED ORDER — CHLORHEXIDINE GLUCONATE CLOTH 2 % EX PADS: 6.0000 | MEDICATED_PAD | Freq: Once | CUTANEOUS | Status: DC

## 2019-08-06 MED ORDER — HYDRALAZINE HCL 20 MG/ML IJ SOLN
INTRAMUSCULAR | Status: AC
  Filled 2019-08-06: qty 1

## 2019-08-06 MED ORDER — CELECOXIB 200 MG PO CAPS
200.0000 mg | ORAL_CAPSULE | ORAL | Status: AC
  Administered 2019-08-06: 200 mg via ORAL
  Filled 2019-08-06: qty 1

## 2019-08-06 MED ORDER — FENTANYL CITRATE (PF) 100 MCG/2ML IJ SOLN
INTRAMUSCULAR | Status: AC
  Filled 2019-08-06: qty 2

## 2019-08-06 MED ORDER — MIDAZOLAM HCL 5 MG/5ML IJ SOLN
INTRAMUSCULAR | Status: DC | PRN
  Administered 2019-08-06: 2 mg via INTRAVENOUS

## 2019-08-06 MED ORDER — KETAMINE HCL 10 MG/ML IJ SOLN
INTRAMUSCULAR | Status: DC | PRN
  Administered 2019-08-06: 30 mg via INTRAVENOUS
  Administered 2019-08-06: 50 mg via INTRAVENOUS
  Administered 2019-08-06: 20 mg via INTRAVENOUS

## 2019-08-06 MED ORDER — SUGAMMADEX SODIUM 200 MG/2ML IV SOLN
INTRAVENOUS | Status: DC | PRN
  Administered 2019-08-06: 200 mg via INTRAVENOUS

## 2019-08-06 MED ORDER — MIDAZOLAM HCL 2 MG/2ML IJ SOLN
INTRAMUSCULAR | Status: AC
  Filled 2019-08-06: qty 2

## 2019-08-06 MED ORDER — HYDROMORPHONE HCL 1 MG/ML IJ SOLN
INTRAMUSCULAR | Status: DC | PRN
  Administered 2019-08-06: .5 mg via INTRAVENOUS

## 2019-08-06 MED ORDER — LIDOCAINE 2% (20 MG/ML) 5 ML SYRINGE
INTRAMUSCULAR | Status: AC
  Filled 2019-08-06: qty 5

## 2019-08-06 MED ORDER — GABAPENTIN 300 MG PO CAPS
300.0000 mg | ORAL_CAPSULE | ORAL | Status: AC
  Administered 2019-08-06: 300 mg via ORAL
  Filled 2019-08-06: qty 1

## 2019-08-06 MED ORDER — BUPIVACAINE HCL (PF) 0.25 % IJ SOLN
INTRAMUSCULAR | Status: DC | PRN
  Administered 2019-08-06: 50 mL

## 2019-08-06 MED ORDER — HYDROMORPHONE HCL 2 MG/ML IJ SOLN
INTRAMUSCULAR | Status: AC
  Filled 2019-08-06: qty 1

## 2019-08-06 MED ORDER — HYDRALAZINE HCL 20 MG/ML IJ SOLN
INTRAMUSCULAR | Status: DC | PRN
Start: 2019-08-06 — End: 2019-08-06
  Administered 2019-08-06: 10 mg via INTRAVENOUS

## 2019-08-06 MED ORDER — CHLORHEXIDINE GLUCONATE 0.12 % MT SOLN
15.0000 mL | Freq: Once | OROMUCOSAL | Status: AC
  Administered 2019-08-06: 15 mL via OROMUCOSAL

## 2019-08-06 MED ORDER — KETAMINE HCL 10 MG/ML IJ SOLN
INTRAMUSCULAR | Status: AC
  Filled 2019-08-06: qty 1

## 2019-08-06 MED ORDER — LIDOCAINE 2% (20 MG/ML) 5 ML SYRINGE
INTRAMUSCULAR | Status: DC | PRN
  Administered 2019-08-06: 80 mg via INTRAVENOUS

## 2019-08-06 MED ORDER — ACETAMINOPHEN 500 MG PO TABS
1000.0000 mg | ORAL_TABLET | ORAL | Status: AC
  Administered 2019-08-06: 1000 mg via ORAL
  Filled 2019-08-06: qty 2

## 2019-08-06 MED ORDER — ORAL CARE MOUTH RINSE: 15.0000 mL | Freq: Once | OROMUCOSAL | Status: AC

## 2019-08-06 MED ORDER — OXYCODONE HCL 5 MG PO TABS
5.0000 mg | ORAL_TABLET | Freq: Once | ORAL | Status: AC
  Administered 2019-08-06: 5 mg via ORAL

## 2019-08-06 MED ORDER — RINGERS IRRIGATION IR SOLN
Status: DC | PRN
  Administered 2019-08-06: 1000 mL

## 2019-08-06 MED ORDER — FENTANYL CITRATE (PF) 100 MCG/2ML IJ SOLN
INTRAMUSCULAR | Status: DC | PRN
  Administered 2019-08-06 (×2): 50 ug via INTRAVENOUS
  Administered 2019-08-06: 100 ug via INTRAVENOUS

## 2019-08-06 MED ORDER — ROCURONIUM BROMIDE 10 MG/ML (PF) SYRINGE
PREFILLED_SYRINGE | INTRAVENOUS | Status: DC | PRN
  Administered 2019-08-06: 20 mg via INTRAVENOUS
  Administered 2019-08-06: 60 mg via INTRAVENOUS
  Administered 2019-08-06: 10 mg via INTRAVENOUS

## 2019-08-06 MED ORDER — BUPIVACAINE LIPOSOME 1.3 % IJ SUSP
INTRAMUSCULAR | Status: DC | PRN
  Administered 2019-08-06: 20 mL

## 2019-08-06 MED ORDER — ONDANSETRON HCL 4 MG/2ML IJ SOLN
INTRAMUSCULAR | Status: AC
  Filled 2019-08-06: qty 2

## 2019-08-06 MED ORDER — ONDANSETRON HCL 4 MG/2ML IJ SOLN
INTRAMUSCULAR | Status: DC | PRN
  Administered 2019-08-06: 4 mg via INTRAVENOUS

## 2019-08-06 MED ORDER — FENTANYL CITRATE (PF) 100 MCG/2ML IJ SOLN
25.0000 ug | INTRAMUSCULAR | Status: DC | PRN
  Administered 2019-08-06 (×2): 50 ug via INTRAVENOUS

## 2019-08-06 MED ORDER — 0.9 % SODIUM CHLORIDE (POUR BTL) OPTIME
TOPICAL | Status: DC | PRN
  Administered 2019-08-06: 2000 mL

## 2019-08-06 MED ORDER — PROPOFOL 10 MG/ML IV BOLUS
INTRAVENOUS | Status: AC
  Filled 2019-08-06: qty 20

## 2019-08-06 MED ORDER — LABETALOL HCL 5 MG/ML IV SOLN
10.0000 mg | Freq: Once | INTRAVENOUS | Status: AC
  Administered 2019-08-06: 10 mg via INTRAVENOUS

## 2019-08-06 MED ORDER — BUPIVACAINE HCL 0.25 % IJ SOLN
INTRAMUSCULAR | Status: AC
  Filled 2019-08-06: qty 1

## 2019-08-06 SURGICAL SUPPLY — 38 items
APL PRP STRL LF DISP 70% ISPRP (MISCELLANEOUS) ×1
CABLE HIGH FREQUENCY MONO STRZ (ELECTRODE) ×3 IMPLANT
CHLORAPREP W/TINT 26 (MISCELLANEOUS) ×3 IMPLANT
COVER SURGICAL LIGHT HANDLE (MISCELLANEOUS) ×3 IMPLANT
COVER WAND RF STERILE (DRAPES) IMPLANT
DECANTER SPIKE VIAL GLASS SM (MISCELLANEOUS) ×3 IMPLANT
DEVICE SECURE STRAP 25 ABSORB (INSTRUMENTS) IMPLANT
DRAPE WARM FLUID 44X44 (DRAPES) ×3 IMPLANT
DRSG TEGADERM 2-3/8X2-3/4 SM (GAUZE/BANDAGES/DRESSINGS) ×4 IMPLANT
DRSG TEGADERM 4X4.75 (GAUZE/BANDAGES/DRESSINGS) ×3 IMPLANT
ELECT REM PT RETURN 15FT ADLT (MISCELLANEOUS) ×3 IMPLANT
GAUZE SPONGE 2X2 8PLY STRL LF (GAUZE/BANDAGES/DRESSINGS) ×1 IMPLANT
GLOVE ECLIPSE 8.0 STRL XLNG CF (GLOVE) ×3 IMPLANT
GLOVE INDICATOR 8.0 STRL GRN (GLOVE) ×3 IMPLANT
GOWN STRL REUS W/TWL XL LVL3 (GOWN DISPOSABLE) ×6 IMPLANT
IRRIG SUCT STRYKERFLOW 2 WTIP (MISCELLANEOUS) ×3
IRRIGATION SUCT STRKRFLW 2 WTP (MISCELLANEOUS) IMPLANT
KIT BASIN (CUSTOM PROCEDURE TRAY) ×3 IMPLANT
KIT TURNOVER KIT A (KITS) IMPLANT
MESH ULTRAPRO 6X6 15CM15CM (Mesh General) ×6 IMPLANT
NDL INSUFFLATION 14GA 120MM (NEEDLE) IMPLANT
NEEDLE INSUFFLATION 14GA 120MM (NEEDLE) IMPLANT
PAD POSITIONING PINK XL (MISCELLANEOUS) ×3 IMPLANT
SCISSORS LAP 5X35 DISP (ENDOMECHANICALS) ×3 IMPLANT
SET TUBE SMOKE EVAC HIGH FLOW (TUBING) ×3 IMPLANT
SLEEVE ADV FIXATION 5X100MM (TROCAR) ×3 IMPLANT
SPONGE GAUZE 2X2 STER 10/PKG (GAUZE/BANDAGES/DRESSINGS) ×2
SUT MNCRL AB 4-0 PS2 18 (SUTURE) ×3 IMPLANT
SUT PDS AB 1 CT1 27 (SUTURE) ×6 IMPLANT
SUT VIC AB 2-0 SH 27 (SUTURE)
SUT VIC AB 2-0 SH 27X BRD (SUTURE) IMPLANT
SUT VICRYL 0 UR6 27IN ABS (SUTURE) ×3 IMPLANT
TACKER 5MM HERNIA 3.5CML NAB (ENDOMECHANICALS) IMPLANT
TOWEL OR 17X26 10 PK STRL BLUE (TOWEL DISPOSABLE) ×3 IMPLANT
TOWEL OR NON WOVEN STRL DISP B (DISPOSABLE) ×3 IMPLANT
TRAY LAPAROSCOPIC (CUSTOM PROCEDURE TRAY) ×3 IMPLANT
TROCAR ADV FIXATION 5X100MM (TROCAR) ×3 IMPLANT
TROCAR XCEL BLUNT TIP 100MML (ENDOMECHANICALS) ×3 IMPLANT

## 2019-08-06 NOTE — Op Note (Addendum)
08/06/2019  6:13 PM  PATIENT:  Jason Duarte  59 y.o. male  Patient Care Team: Shon Baton, MD as PCP - General (Internal Medicine) Hasson Boston, MD as Consulting Physician (General Surgery) Milus Banister, MD as Attending Physician (Gastroenterology) Ardis Hughs, MD as Attending Physician (Urology) Jovita Gamma, MD as Consulting Physician (Neurosurgery)  PRE-OPERATIVE DIAGNOSIS:  RIGHT AND POSSIBLE LEFT INGUINAL HERNIA  POST-OPERATIVE DIAGNOSIS:  BILATERAL INGUINAL HERNIAS   PROCEDURE:  LAPAROSCOPIC BILATERAL INGUINAL HERNIA REPAIRS WITH MESH  SURGEON:  [name], MD  ASSISTANT: None  ANESTHESIA:     Regional ilioinguinal and genitofemoral and spermatic cord nerve blocks  General  Nerve block provided with liposomal bupivacaine (Experel) mixed with 0.25% bupivacaine as a Bilateral TAP block x 4mL each side at the level of the transverse abdominis & preperitoneal spaces along the flank at the anterior axillary line, from subcostal ridge to iliac crest under laparoscopic guidance    EBL:  Total I/O In: 1100 [I.V.:1000; IV Piggyback:100] Out: - .  See anesthesia record  Delay start of Pharmacological VTE agent (>24hrs) due to surgical blood loss or risk of bleeding:  no  DRAINS: NONE  SPECIMEN:  NONE  DISPOSITION OF SPECIMEN:  N/A  COUNTS:  YES  PLAN OF CARE: Discharge to home after PACU  PATIENT DISPOSITION:  PACU - hemodynamically stable.  INDICATION: Patient with worsening recurrent right groin pain with obvious hernia on exam. Impulse on left side suspicious for contralateral left inguinal hernia as well. I recommended laparoscopic exploration and repair of hernias found.  The anatomy & physiology of the abdominal wall and pelvic floor was discussed.  The pathophysiology of hernias in the inguinal and pelvic region was discussed.  Natural history risks such as progressive enlargement, pain, incarceration & strangulation was discussed.    Contributors to complications such as smoking, obesity, diabetes, prior surgery, etc were discussed.    I feel the risks of no intervention will lead to serious problems that outweigh the operative risks; therefore, I recommended surgery to reduce and repair the hernia.  I explained laparoscopic techniques with possible need for an open approach.  I noted usual use of mesh to patch and/or buttress hernia repair  Risks such as bleeding, infection, abscess, need for further treatment, heart attack, death, and other risks were discussed.  I noted a good likelihood this will help address the problem.   Goals of post-operative recovery were discussed as well.  Possibility that this will not correct all symptoms was explained.  I stressed the importance of low-impact activity, aggressive pain control, avoiding constipation, & not pushing through pain to minimize risk of post-operative chronic pain or injury. Possibility of reherniation was discussed.  We will work to minimize complications.     An educational handout further explaining the pathology & treatment options was given as well.  Questions were answered.  The patient expresses understanding & wishes to proceed with surgery.  OR FINDINGS: Indirect inguinal hernia on the right side containing spermatic cord lipoma. Some laxity at the femoral canal but no true hernia. No direct space inguinal hernia nor obturator hernia.  On the left side subtle left indirect inguinal hernia. No direct space, femoral, obturator hernias.  DESCRIPTION:  The patient was identified & brought into the operating room. The patient was positioned supine with arms tucked. SCDs were active during the entire case. The patient underwent general anesthesia without any difficulty.  The abdomen was prepped and draped in a sterile fashion. The  patient's bladder was emptied.  A Surgical Timeout confirmed our plan.  I made a transverse incision through the inferior umbilical fold.  I  made a small transverse nick through the anterior rectus fascia contralateral to the inguinal hernia side and placed a 0-vicryl stitch through the fascia.  I placed a Hasson trocar into the preperitoneal plane.  Entry was clean.  We induced carbon dioxide insufflation. Camera inspection revealed no injury.  I used a 25mm angled scope to bluntly free the peritoneum off the infraumbilical anterior abdominal wall.  I created enough of a preperitoneal pocket to place 29mm ports into the right & left mid-abdomen into this preperitoneal cavity.  I focused attention on the RIGHT pelvis since that was the dominant hernia side.   I used blunt & focused sharp dissection to free the peritoneum off the flank and down to the pubic rim.  I freed the anteriolateral bladder wall off the anteriolateral pelvic wall, sparing midline attachments.   I located a swath of peritoneum going into a hernia fascial defect at the  internal ring consistent with  an indirect inguinal hernia..  I gradually freed the peritoneal hernia sac off safely and reduced it into the preperitoneal space.  I freed the peritoneum off the spermatic vessels & vas deferens.  I freed peritoneum off the retroperitoneum along the psoas muscle.  Spermatic cord lipoma was dissected away & removed.  I checked & assured hemostasis.     I turned attention on the opposite  LEFT pelvis.  I did dissection in a similar, mirror-image fashion. The patient had an indirect inguinal hernia.  Smaller.   Spermatic cord lipoma was dissected away & removed.    I checked & assured hemostasis.     I chose 15x15 cm sheets of ultra-lightweight polypropylene mesh (Ultrapro), one for each side.  I cut a single sigmoid-shaped slit ~6cm from a corner of each mesh.  I placed the meshes into the preperitoneal space & laid them as overlapping diamonds such that at the inferior points, a 6x6 cm corner flap rested in the true anterolateral pelvis, covering the obturator & femoral foramina.    I allowed the bladder to return to the pubis, this helping tuck the corners of the mesh in the anteriolateral pelvis.  The medial corners overlapped each other across midline cephalad to the pubic rim.   Given the numerous hernias, I placed a third 15x15cm mesh in the center as a vertical diamond.  The lateral wings of the mesh overlap across the direct spaces and internal rings where the dominant hernias were.  This provided good coverage and reinforcement of the hernia repairs.  Because of the central mesh placement with good overlap, I did not place any tacks.   I held the hernia sacs cephalad & evacuated carbon dioxide.  I closed the fascia with absorbable suture.  I closed the skin using 4-0 monocryl stitch.  Sterile dressings were applied.   The patient was extubated & arrived in the PACU in stable condition..  I had discussed postoperative care with the patient in the holding area.  Instructions are written in the chart.  I discussed operative findings, updated the patient's status, discussed probable steps to recovery, and gave postoperative recommendations to the patient's spouse, Jason Duarte.  Recommendations were made.  Questions were answered.  She expressed understanding & appreciation.   [name], M.D., F.A.C.S. Gastrointestinal and Minimally Invasive Surgery [facility] [address] [place], [place]  [address] [phone] Main / Paging  08/06/2019 6:13 PM

## 2019-08-06 NOTE — Progress Notes (Signed)
Wife was concerned regarding HBP, prior surg BP was 155/89~135/84, usually when she check pt"s BP was 145/96 at home. Reminded pt to f/u with primary Dr to control BP. Pain level was 4/10 sore , queeze  Stomach  When ambulated.  offered to use BR, pt refused. He went to BR prior surg, and did not felt  Needed to use BR.  remined pt and wife use CPAP when arrived at home due to pt .fell a sleep easily. And h/o sleep apnea. DC instruction given to wife.and understood.

## 2019-08-06 NOTE — Discharge Instructions (Signed)
HERNIA REPAIR: POST OP INSTRUCTIONS ° °###################################################################### ° °EAT °Gradually transition to a high fiber diet with a fiber supplement over the next few weeks after discharge.  Start with a pureed / full liquid diet (see below) ° °WALK °Walk an hour a day.  Control your pain to do that.   ° °CONTROL PAIN °Control pain so that you can walk, sleep, tolerate sneezing/coughing, and go up/down stairs. ° °HAVE A BOWEL MOVEMENT DAILY °Keep your bowels regular to avoid problems.  OK to try a laxative to override constipation.  OK to use an antidairrheal to slow down diarrhea.  Call if not better after 2 tries ° °CALL IF YOU HAVE PROBLEMS/CONCERNS °Call if you are still struggling despite following these instructions. °Call if you have concerns not answered by these instructions ° °###################################################################### ° ° ° °1. DIET: Follow a light bland diet & liquids the first 24 hours after arrival home, such as soup, liquids, starches, etc.  Be sure to drink plenty of fluids.  Quickly advance to a usual solid diet within a few days.  Avoid fast food or heavy meals as your are more likely to get nauseated or have irregular bowels.  A low-fat, high-fiber diet for the rest of your life is ideal.  ° °2. Take your usually prescribed home medications unless otherwise directed. ° °3. PAIN CONTROL: °a. Pain is best controlled by a usual combination of three different methods TOGETHER: °i. Ice/Heat °ii. Over the counter pain medication °iii. Prescription pain medication °b. Most patients will experience some swelling and bruising around the hernia(s) such as the bellybutton, groins, or old incisions.  Ice packs or heating pads (30-60 minutes up to 6 times a day) will help. Use ice for the first few days to help decrease swelling and bruising, then switch to heat to help relax tight/sore spots and speed recovery.  Some people prefer to use ice  alone, heat alone, alternating between ice & heat.  Experiment to what works for you.  Swelling and bruising can take several weeks to resolve.   °c. It is helpful to take an over-the-counter pain medication regularly for the first few weeks.  Choose one of the following that works best for you: °i. Naproxen (Aleve, etc)  Two 220mg tabs twice a day °ii. Ibuprofen (Advil, etc) Three 200mg tabs four times a day (every meal & bedtime) °iii. Acetaminophen (Tylenol, etc) 325-650mg four times a day (every meal & bedtime) °d. A  prescription for pain medication should be given to you upon discharge.  Take your pain medication as prescribed.  °i. If you are having problems/concerns with the prescription medicine (does not control pain, nausea, vomiting, rash, itching, etc), please call us [phone] to see if we need to switch you to a different pain medicine that will work better for you and/or control your side effect better. °ii. If you need a refill on your pain medication, please contact your pharmacy.  They will contact our office to request authorization. Prescriptions will not be filled after 5 pm or on week-ends. ° °4. Avoid getting constipated.  Between the surgery and the pain medications, it is common to experience some constipation.  Increasing fluid intake and taking a fiber supplement (such as Metamucil, Citrucel, FiberCon, MiraLax, etc) 1-2 times a day regularly will usually help prevent this problem from occurring.  A mild laxative (prune juice, Milk of Magnesia, MiraLax, etc) should be taken according to package directions if there are no bowel movements after 48   hours.   ° °5. Wash / shower every day.  You may shower over the dressings as they are waterproof.   ° °6. Remove your waterproof bandages, skin tapes, and other bandages 5 days after surgery. You may replace a dressing/Band-Aid to cover the incision for comfort if you wish. You may leave the incisions open to air.  You may replace a  dressing/Band-Aid to cover an incision for comfort if you wish.  Continue to shower over incision(s) after the dressing is off. ° °7. ACTIVITIES as tolerated:   °a. You may resume regular (light) daily activities beginning the next day--such as daily self-care, walking, climbing stairs--gradually increasing activities as tolerated.  Control your pain so that you can walk an hour a day.  If you can walk 30 minutes without difficulty, it is safe to try more intense activity such as jogging, treadmill, bicycling, low-impact aerobics, swimming, etc. °b. Save the most intensive and strenuous activity for last such as sit-ups, heavy lifting, contact sports, etc  Refrain from any heavy lifting or straining until you are off narcotics for pain control.   °c. DO NOT PUSH THROUGH PAIN.  Let pain be your guide: If it hurts to do something, don't do it.  Pain is your body warning you to avoid that activity for another week until the pain goes down. °d. You may drive when you are no longer taking prescription pain medication, you can comfortably wear a seatbelt, and you can safely maneuver your car and apply brakes. °e. You may have sexual intercourse when it is comfortable.  ° °8. FOLLOW UP in our office °a. Please call [facility] at [phone] to set up an appointment to see your surgeon in the office for a follow-up appointment approximately 2-3 weeks after your surgery. °b. Make sure that you call for this appointment the day you arrive home to insure a convenient appointment time. ° °9.  If you have disability of FMLA / Family leave forms, please bring the forms to the office for processing.  (do not give to your surgeon). ° °WHEN TO CALL US [phone]: °1. Poor pain control °2. Reactions / problems with new medications (rash/itching, nausea, etc)  °3. Fever over 101.5 F (38.5 C) °4. Inability to urinate °5. Nausea and/or vomiting °6. Worsening swelling or bruising °7. Continued bleeding from incision. °8. Increased pain,  redness, or drainage from the incision ° ° The clinic staff is available to answer your questions during regular business hours (8:30am-5pm).  Please don’t hesitate to call and ask to speak to one of our nurses for clinical concerns.  ° If you have a medical emergency, go to the nearest emergency room or call 911. ° A surgeon from [facility] is always on call at the hospitals in [place] ° °[facility] °[address], [address], [place], [place]  [address] ? ° [address] [address], [place],    [address] °MAIN: [phone] ? TOLL FREE: [phone] ? FAX: [phone] °[email or web address] ° °

## 2019-08-06 NOTE — Transfer of Care (Signed)
Immediate Anesthesia Transfer of Care Note  Patient: Jason Duarte  Procedure(s) Performed: Procedure(s): LAPAROSCOPIC BILATERAL HERNIA REPAIR WITH MESH, TAP BLOCK (Right)  Patient Location: PACU  Anesthesia Type:General  Level of Consciousness: Alert, Awake, Oriented  Airway & Oxygen Therapy: Patient Spontanous Breathing  Post-op Assessment: Report given to RN  Post vital signs: Reviewed and stable  Last Vitals:  Vitals:   08/06/19 1357  BP: 135/84  Pulse: 67  Resp: 18  Temp: 37.5 C  SpO2: 646%    Complications: No apparent anesthesia complications

## 2019-08-06 NOTE — Anesthesia Procedure Notes (Signed)
Procedure Name: Intubation Date/Time: 08/06/2019 4:46 PM Performed by: Gerald Leitz, CRNA Pre-anesthesia Checklist: Patient identified, Patient being monitored, Timeout performed, Emergency Drugs available and Suction available Patient Re-evaluated:Patient Re-evaluated prior to induction Oxygen Delivery Method: Circle system utilized Preoxygenation: Pre-oxygenation with 100% oxygen Induction Type: IV induction Ventilation: Mask ventilation without difficulty Laryngoscope Size: Mac and 3 Grade View: Grade III Tube type: Oral Tube size: 7.5 mm Number of attempts: 3 Airway Equipment and Method: Stylet and Video-laryngoscopy Placement Confirmation: ETT inserted through vocal cords under direct vision,  positive ETCO2 and breath sounds checked- equal and bilateral Secured at: 24 cm Tube secured with: Tape Dental Injury: Teeth and Oropharynx as per pre-operative assessment  Difficulty Due To: Difficult Airway- due to immobile epiglottis, Difficult Airway- due to anterior larynx and Difficulty was unanticipated Comments: Multiple DL w MAC 3 and MAC by CRNA and MDA; Glide scope ETT placed successfully

## 2019-08-06 NOTE — Interval H&P Note (Signed)
History and Physical Interval Note:  08/06/2019 4:23 PM  Jason Duarte  has presented today for surgery, with the diagnosis of RIGHT AND POSSIBLE LEFT INGUINAL HERNIA.  The various methods of treatment have been discussed with the patient and family. After consideration of risks, benefits and other options for treatment, the patient has consented to  Procedure(s): LAPAROSCOPIC RIGHT AND POSSIBLE LEFT INGUINAL HERNIA REPAIR [facility] MESH (Right) as a surgical intervention.  The patient's history has been reviewed, patient examined, no change in status, stable for surgery.  I have reviewed the patient's chart and labs.  Questions were answered to the patient's satisfaction.     I have re-reviewed the the patient's records, history, medications, and allergies.  I have re-examined the patient.  I again discussed intraoperative plans and goals of post-operative recovery.  The patient agrees to proceed.  Jason Duarte  12-03-1960 [id number]  Patient Care Team: Shon Baton, MD as PCP - General (Internal Medicine) Arty Boston, MD as Consulting Physician (General Surgery) Milus Banister, MD as Attending Physician (Gastroenterology) Ardis Hughs, MD as Attending Physician (Urology) Jovita Gamma, MD as Consulting Physician (Neurosurgery)  Patient Active Problem List   Diagnosis Date Noted   Snoring 12/06/2015   Hypersomnia with sleep apnea 12/06/2015   Excessive daytime sleepiness 12/06/2015    Past Medical History:  Diagnosis Date   Sleep apnea     Past Surgical History:  Procedure Laterality Date   LUMBAR [place] SURGERY  2017   removal foreign body leg Left 2013   VASECTOMY  2002   VASECTOMY REVERSAL  2008    Social History   Socioeconomic History   Marital status: Married    Spouse name: Not on file   Number of children: Not on file   Years of education: Not on file   Highest education level: Not on file  Occupational History   Not on file  Tobacco Use    Smoking status: Former Smoker    Types: Cigars    Quit date: 02/25/2006    Years since quitting: 13.4   Smokeless tobacco: Never Used   Tobacco comment: rare cigar  Vaping Use   Vaping Use: Never used  Substance and Sexual Activity   Alcohol use: Yes    Alcohol/week: 7.0 standard drinks    Types: 7 Glasses of wine per week    Comment: occas.   Drug use: No   Sexual activity: Not on file  Other Topics Concern   Not on file  Social History Narrative   Not on file   Social Determinants of Health   Financial Resource Strain:    Difficulty of Paying Living Expenses:   Food Insecurity:    Worried About [place] in the Last Year:    Arboriculturist in the Last Year:   Transportation Needs:    Film/video editor (Medical):    Lack of Transportation (Non-Medical):   Physical Activity:    Days of Exercise per Week:    Minutes of Exercise per Session:   Stress:    Feeling of Stress :   Social Connections:    Frequency of Communication with Friends and Family:    Frequency of Social Gatherings with Friends and Family:    Attends Religious Services:    Active Member of Clubs or Organizations:    Attends Archivist Meetings:    Marital Status:   Intimate Partner Violence:    Fear of Current or Ex-Partner:  Emotionally Abused:    Physically Abused:    Sexually Abused:     Family History  Problem Relation Age of Onset   Prostate cancer Father    Colon cancer Neg Hx    Esophageal cancer Neg Hx    Rectal cancer Neg Hx    Stomach cancer Neg Hx     Medications Prior to Admission  Medication Sig Dispense Refill Last Dose   Glucosamine-Chondroit-Vit C-Mn (GLUCOSAMINE 1500 COMPLEX) CAPS Take 1 capsule by mouth daily.   08/05/2019 at Unknown time   Multiple Vitamins-Minerals (MULTIVITAMIN WITH MINERALS) tablet Take 1 tablet by mouth daily.   08/05/2019 at Unknown time   rosuvastatin (CRESTOR) 10 MG tablet Take 10 mg by mouth at bedtime.    08/05/2019 at  Unknown time    Current Facility-Administered Medications  Medication Dose Route Frequency Provider Last Rate Last Admin   bupivacaine liposome (EXPAREL) 1.3 % injection 266 mg  20 mL Infiltration Once Damek Boston, MD       [START ON 08/07/2019] ceFAZolin (ANCEF) IVPB 2g/100 mL premix  2 g Intravenous On Call to OR Camille Boston, MD       Chlorhexidine Gluconate Cloth 2 % PADS 6 each  6 each Topical Once Lamberto Boston, MD       And   Chlorhexidine Gluconate Cloth 2 % PADS 6 each  6 each Topical Once Terryn Boston, MD       [START ON 08/07/2019] feeding supplement (ENSURE PRE-SURGERY) liquid 296 mL  296 mL Oral Once Kainon Boston, MD       lactated ringers infusion   Intravenous Continuous Catalina Gravel, MD 50 mL/hr at 08/06/19 1423 New Bag at 08/06/19 1423     Allergies  Allergen Reactions   Cashew Nut Oil Rash   Poison Ivy Extract Rash    BP 135/84   Pulse 67   Temp 99.5 F (37.5 C) (Oral)   Resp 18   Ht 5\' 11"  (1.803 m)   Wt 88.5 kg   SpO2 100%   BMI 27.21 kg/m   Labs: No results found for this or any previous visit (from the past 48 hour(s)).  Imaging / Studies: No results found.   [name], M.D., F.A.C.S. Gastrointestinal and Minimally Invasive Surgery [facility] [address] [place], [place] [address] [phone] Main / Paging  08/06/2019 4:24 PM    [name]

## 2019-08-06 NOTE — Anesthesia Preprocedure Evaluation (Addendum)
Anesthesia Evaluation  Patient identified by MRN, date of birth, ID band Patient awake    Reviewed: Allergy & Precautions, NPO status , Patient's Chart, lab work & pertinent test results  Airway Mallampati: I  TM Distance: >3 FB Neck ROM: Full    Dental no notable dental hx. (+) Teeth Intact, Dental Advisory Given   Pulmonary sleep apnea and Continuous Positive Airway Pressure Ventilation , former smoker,    Pulmonary exam normal breath sounds clear to auscultation       Cardiovascular Normal cardiovascular exam Rhythm:Regular Rate:Normal  HLD   Neuro/Psych negative neurological ROS  negative psych ROS   GI/Hepatic negative GI ROS, Neg liver ROS,   Endo/Other  negative endocrine ROS  Renal/GU negative Renal ROS  negative genitourinary   Musculoskeletal negative musculoskeletal ROS (+)   Abdominal   Peds  Hematology negative hematology ROS (+)   Anesthesia Other Findings   Reproductive/Obstetrics                            Anesthesia Physical Anesthesia Plan  ASA: II  Anesthesia Plan: General   Post-op Pain Management:    Induction: Intravenous  PONV Risk Score and Plan: 2 and Midazolam, Dexamethasone and Ondansetron  Airway Management Planned: Oral ETT  Additional Equipment:   Intra-op Plan:   Post-operative Plan: Extubation in OR  Informed Consent: I have reviewed the patients History and Physical, chart, labs and discussed the procedure including the risks, benefits and alternatives for the proposed anesthesia with the patient or authorized representative who has indicated his/her understanding and acceptance.     Dental advisory given  Plan Discussed with: CRNA  Anesthesia Plan Comments:         Anesthesia Quick Evaluation

## 2019-08-07 NOTE — Anesthesia Postprocedure Evaluation (Signed)
Anesthesia Post Note  Patient: Jason Duarte  Procedure(s) Performed: LAPAROSCOPIC BILATERAL HERNIA REPAIR WITH MESH, TAP BLOCK (Right )     Patient location during evaluation: PACU Anesthesia Type: General Level of consciousness: awake and alert Pain management: pain level controlled Vital Signs Assessment: post-procedure vital signs reviewed and stable Respiratory status: spontaneous breathing, nonlabored ventilation, respiratory function stable and patient connected to nasal cannula oxygen Cardiovascular status: blood pressure returned to baseline and stable Postop Assessment: no apparent nausea or vomiting Anesthetic complications: no   No complications documented.  Last Vitals:  Vitals:   08/06/19 2015 08/06/19 2025  BP: (!) 167/96 (!) 153/92  Pulse: 74 77  Resp:  16  Temp:  36.5 C  SpO2: 99% 97%    Last Pain:  Vitals:   08/06/19 2025  TempSrc:   PainSc: 4                  Catalina Gravel

## 2019-08-09 ENCOUNTER — Encounter (HOSPITAL_COMMUNITY): Payer: Self-pay | Admitting: Surgery

## 2019-10-06 DIAGNOSIS — G4733 Obstructive sleep apnea (adult) (pediatric): Secondary | ICD-10-CM | POA: Diagnosis not present

## 2019-10-11 ENCOUNTER — Ambulatory Visit (INDEPENDENT_AMBULATORY_CARE_PROVIDER_SITE_OTHER): Payer: BC Managed Care – PPO | Admitting: Cardiovascular Disease

## 2019-10-11 ENCOUNTER — Other Ambulatory Visit: Payer: Self-pay

## 2019-10-11 ENCOUNTER — Encounter: Payer: Self-pay | Admitting: Cardiovascular Disease

## 2019-10-11 VITALS — BP 146/90 | HR 55 | Ht 71.0 in | Wt 200.4 lb

## 2019-10-11 DIAGNOSIS — R0600 Dyspnea, unspecified: Secondary | ICD-10-CM | POA: Diagnosis not present

## 2019-10-11 DIAGNOSIS — I10 Essential (primary) hypertension: Secondary | ICD-10-CM

## 2019-10-11 DIAGNOSIS — R931 Abnormal findings on diagnostic imaging of heart and coronary circulation: Secondary | ICD-10-CM

## 2019-10-11 DIAGNOSIS — R0609 Other forms of dyspnea: Secondary | ICD-10-CM

## 2019-10-11 LAB — BASIC METABOLIC PANEL
BUN/Creatinine Ratio: 12 (ref 9–20)
BUN: 12 mg/dL (ref 6–24)
CO2: 26 mmol/L (ref 20–29)
Calcium: 9.5 mg/dL (ref 8.7–10.2)
Chloride: 103 mmol/L (ref 96–106)
Creatinine, Ser: 1 mg/dL (ref 0.76–1.27)
GFR calc Af Amer: 97 mL/min/{1.73_m2} (ref >59)
GFR calc non Af Amer: 84 mL/min/{1.73_m2} (ref >59)
Glucose: 99 mg/dL (ref 65–99)
Potassium: 4.6 mmol/L (ref 3.5–5.2)
Sodium: 139 mmol/L (ref 134–144)

## 2019-10-11 MED ORDER — LOSARTAN POTASSIUM 25 MG PO TABS
25.0000 mg | ORAL_TABLET | Freq: Every day | ORAL | 3 refills | Status: DC
Start: 2019-10-11 — End: 2020-10-24

## 2019-10-11 NOTE — Patient Instructions (Addendum)
Medication Instructions:  1) START LOSARTAN 25 mg daily *If you need a refill on your cardiac medications before your next appointment, please call your pharmacy*  Lab Work: TODAY! BMET If you have labs (blood work) drawn today and your tests are completely normal, you will receive your results only by: Marland Kitchen MyChart Message (if you have MyChart) OR . A paper copy in the mail If you have any lab test that is abnormal or we need to change your treatment, we will call you to review the results.  Testing/Procedures: Your provider has requested that you have an echocardiogram. Echocardiography is a painless test that uses sound waves to create images of your heart. It provides your doctor with information about the size and shape of your heart and how well your heart's chambers and valves are working. This procedure takes approximately one hour. There are no restrictions for this procedure.   Dr. Angelena Form recommends you have a CARDIAC CT.  Follow-Up: Your provider recommends that you schedule a follow-up appointment in 6-8 weeks with Dr. Angelena Form.  CARDIAC CT INSTRUCTIONS: Your cardiac CT will be scheduled at one of the below locations:   [facility] [address] [place], [place] [address] (336) [place] [address] [place], [place] [address] [phone]  If scheduled at [facility], please arrive at the [facility] main entrance of [facility] 30 minutes prior to test start time. Proceed to the [facility] Radiology Department (first floor) to check-in and test prep.  If scheduled at [facility], please arrive 15 mins early for check-in and test prep.  Please follow these instructions carefully:  Hold all erectile dysfunction medications at least 3 days (72 hrs) prior to test.  On the Night Before the Test: . Be sure to Drink plenty of water. . Do not consume  any caffeinated/decaffeinated beverages or chocolate 12 hours prior to your test. . Do not take any antihistamines 12 hours prior to your test.  On the Day of the Test: . Drink plenty of water. Do not drink any water within one hour of the test. . Do not eat any food 4 hours prior to the test. . You may take your regular medications prior to the test.       After the Test: . Drink plenty of water. . After receiving IV contrast, you may experience a mild flushed feeling. This is normal. . On occasion, you may experience a mild rash up to 24 hours after the test. This is not dangerous. If this occurs, you can take Benadryl 25 mg and increase your fluid intake. . If you experience trouble breathing, this can be serious. If it is severe call 911 IMMEDIATELY. If it is mild, please call our office. . If you take any of these medications: Glipizide/Metformin, Avandament, Glucavance, please do not take 48 hours after completing test unless otherwise instructed.  Once we have confirmed authorization from your insurance company, we will call you to set up a date and time for your test. Based on how quickly your insurance processes prior authorizations requests, please allow up to 4 weeks to be contacted for scheduling your Cardiac CT appointment. Be advised that routine Cardiac CT appointments could be scheduled as many as 8 weeks after your provider has ordered it.  For non-scheduling related questions, please contact the cardiac imaging nurse navigator should you have any questions/concerns: Marchia Bond, Cardiac Imaging Nurse  Navigator Burley Saver, Interim Cardiac Imaging Nurse Navigator [facility] Direct Office Dial: [phone]   For scheduling needs, including cancellations and rescheduling, please call Vivien Rota at [phone], option [phone].

## 2019-10-11 NOTE — Progress Notes (Addendum)
Chief Complaint  Patient presents with  . New Patient (Initial Visit)    CAD   History of Present Illness: 59 yo male with history of HTN, hyperlipidemia and sleep apnea who is here today as a new consult, referred by Dr. Virgina Jock, for evaluation of an abnormal calcium score on outside study. (Addendum 10/18/19: Report received from patient. Calcium score 589). He is very active. He has no prior cardiac events. He has no chest pain but has dyspnea at times with exertion. He had a recent hernia surgery and has been less active lately. No LE edema.   Primary Care Physician: Shon Baton, MD   Past Medical History:  Diagnosis Date  . Abdominal pain   . Bilateral inguinal herniae s/p lap repair w mesh 08/06/2019 08/06/2019  . Colon polyps   . DDD (degenerative disc disease), lumbar   . Excessive daytime sleepiness 12/06/2015  . Family history of early CAD   . Floaters   . Hyperlipidemia   . Hypersomnia with sleep apnea 12/06/2015  . Hypertriglyceridemia   . Leukopenia   . OA (osteoarthritis) of ankle   . Overweight   . Scrotal pain   . Skin mole   . Sleep apnea   . Snoring     Past Surgical History:  Procedure Laterality Date  . INGUINAL HERNIA REPAIR Right 08/06/2019   Procedure: LAPAROSCOPIC BILATERAL HERNIA REPAIR WITH MESH, TAP BLOCK;  Surgeon: Brittney Boston, MD;  Location: [facility];  Service: General;  Laterality: Right;  . LUMBAR [place] SURGERY  2017  . removal foreign body leg Left 2013  . VASECTOMY  2002  . VASECTOMY REVERSAL  2008    Current Outpatient Medications  Medication Sig Dispense Refill  . Glucosamine-Chondroit-Vit C-Mn (GLUCOSAMINE 1500 COMPLEX) CAPS Take 1 capsule by mouth daily.    . Multiple Vitamins-Minerals (MULTIVITAMIN WITH MINERALS) tablet Take 1 tablet by mouth daily.    . rosuvastatin (CRESTOR) 10 MG tablet Take 10 mg by mouth at bedtime.     . traMADol (ULTRAM) 50 MG tablet Take 1-2 tablets (50-100 mg total) by mouth every 6 (six) hours as needed for  moderate pain or severe pain. 20 tablet 0  . losartan (COZAAR) 25 MG tablet Take 1 tablet (25 mg total) by mouth daily. 90 tablet 3   No current facility-administered medications for this visit.    Allergies  Allergen Reactions  . Cashew Nut Oil Rash  . Poison Ivy Extract Rash    Social History   Socioeconomic History  . Marital status: Married    Spouse name: Not on file  . Number of children: 6  . Years of education: Not on file  . Highest education level: Not on file  Occupational History  . Occupation: Freight forwarder  Tobacco Use  . Smoking status: Former Smoker    Types: Cigars    Quit date: 02/25/2006    Years since quitting: 13.6  . Smokeless tobacco: Never Used  . Tobacco comment: rare cigar  Vaping Use  . Vaping Use: Never used  Substance and Sexual Activity  . Alcohol use: Yes    Alcohol/week: 7.0 standard drinks    Types: 7 Glasses of wine per week    Comment: occas.  . Drug use: No  . Sexual activity: Not on file  Other Topics Concern  . Not on file  Social History Narrative  . Not on file   Social Determinants of Health   Financial Resource Strain:   . Difficulty of Paying  Living Expenses:   Food Insecurity:   . Worried About Charity fundraiser in the Last Year:   . Arboriculturist in the Last Year:   Transportation Needs:   . Film/video editor (Medical):   Marland Kitchen Lack of Transportation (Non-Medical):   Physical Activity:   . Days of Exercise per Week:   . Minutes of Exercise per Session:   Stress:   . Feeling of Stress :   Social Connections:   . Frequency of Communication with Friends and Family:   . Frequency of Social Gatherings with Friends and Family:   . Attends Religious Services:   . Active Member of Clubs or Organizations:   . Attends Archivist Meetings:   Marland Kitchen Marital Status:   Intimate Partner Violence:   . Fear of Current or Ex-Partner:   . Emotionally Abused:   Marland Kitchen Physically Abused:   . Sexually Abused:     Family  History  Problem Relation Age of Onset  . Obesity Mother   . Prostate cancer Father   . CAD Father   . Heart failure Maternal Grandfather   . Colon cancer Neg Hx   . Esophageal cancer Neg Hx   . Rectal cancer Neg Hx   . Stomach cancer Neg Hx     Review of Systems:  As stated in the HPI and otherwise negative.   BP (!) 146/90   Pulse (!) 55   Ht 5\' 11"  (1.803 m)   Wt 200 lb 6.4 oz (90.9 kg)   SpO2 97%   BMI 27.95 kg/m   Physical Examination: General: Well developed, well nourished, NAD  HEENT: OP clear, mucus membranes moist  SKIN: warm, dry. No rashes. Neuro: No focal deficits  Musculoskeletal: Muscle strength 5/5 all ext  Psychiatric: Mood and affect normal  Neck: No JVD, no carotid bruits, no thyromegaly, no lymphadenopathy.  Lungs:Clear bilaterally, no wheezes, rhonci, crackles Cardiovascular: Regular rate and rhythm. No murmurs, gallops or rubs. Abdomen:Soft. Bowel sounds present. Non-tender.  Extremities: No lower extremity edema. Pulses are 2 + in the bilateral DP/PT.  EKG:  EKG is ordered today. The ekg ordered today demonstrates Sinus brady  Recent Labs: 07/30/2019: Hemoglobin 15.2; Platelets 167   Lipid Panel No results found for: CHOL, TRIG, HDL, CHOLHDL, VLDL, LDLCALC, LDLDIRECT   Wt Readings from Last 3 Encounters:  10/11/19 200 lb 6.4 oz (90.9 kg)  08/06/19 195 lb 1.7 oz (88.5 kg)  07/30/19 195 lb (88.5 kg)     Assessment and Plan:   1. Dyspnea/Abnormal Calcium score: He has dyspnea with exertion. He reports an abnormal coronary calcium score. We do not have this report and it was not available when we called [facility]. Will arrange a gated cardiac CTA and echocardiogram.   2. HTN: Will start Cozaar 25 mg daily.   Current medicines are reviewed at length with the patient today.  The patient does not have concerns regarding medicines.  The following changes have been made:  no change  Labs/ tests ordered today include:   Orders Placed This Encounter    Procedures  . CT CORONARY MORPH W/CTA COR W/SCORE W/CA W/CM &/OR WO/CM  . CT CORONARY FRACTIONAL FLOW RESERVE DATA PREP  . CT CORONARY FRACTIONAL FLOW RESERVE FLUID ANALYSIS  . Basic metabolic panel  . EKG 12-Lead  . ECHOCARDIOGRAM COMPLETE     Disposition:   FU with me in 6-8 weeks.    Signed, Lauree Chandler, MD 10/11/2019 9:40 AM  [facility] [place], [place], [place]  [address] Phone: [phone]; Fax: [phone]

## 2019-10-22 ENCOUNTER — Other Ambulatory Visit: Payer: Self-pay

## 2019-10-22 ENCOUNTER — Ambulatory Visit (HOSPITAL_COMMUNITY): Payer: BC Managed Care – PPO | Attending: Cardiovascular Disease

## 2019-10-22 DIAGNOSIS — I10 Essential (primary) hypertension: Secondary | ICD-10-CM | POA: Diagnosis not present

## 2019-10-22 DIAGNOSIS — R0600 Dyspnea, unspecified: Secondary | ICD-10-CM | POA: Diagnosis not present

## 2019-10-22 DIAGNOSIS — R0609 Other forms of dyspnea: Secondary | ICD-10-CM

## 2019-10-22 DIAGNOSIS — R931 Abnormal findings on diagnostic imaging of heart and coronary circulation: Secondary | ICD-10-CM

## 2019-10-22 LAB — ECHOCARDIOGRAM COMPLETE
Area-P 1/2: 3.21 cm2
S' Lateral: 3.1 cm

## 2019-11-05 ENCOUNTER — Telehealth (HOSPITAL_COMMUNITY): Payer: Self-pay | Admitting: Emergency Medicine

## 2019-11-05 NOTE — Telephone Encounter (Signed)
Reaching out to patient to offer assistance regarding upcoming cardiac imaging study; pt verbalizes understanding of appt date/time, parking situation and where to check in, pre-test NPO status and medications ordered, and verified current allergies; name and call back number provided for further questions should they arise Kenya Kook RN Navigator Cardiac Imaging [facility] [phone] office [phone] cell 

## 2019-11-09 ENCOUNTER — Other Ambulatory Visit: Payer: Self-pay

## 2019-11-09 ENCOUNTER — Ambulatory Visit (HOSPITAL_COMMUNITY)
Admission: RE | Admit: 2019-11-09 | Discharge: 2019-11-09 | Disposition: A | Discharge status: Home / Self Care | Payer: BC Managed Care – PPO | Source: Ambulatory Visit | Attending: Cardiovascular Disease | Admitting: Cardiovascular Disease

## 2019-11-09 DIAGNOSIS — R0600 Dyspnea, unspecified: Secondary | ICD-10-CM | POA: Diagnosis not present

## 2019-11-09 DIAGNOSIS — I10 Essential (primary) hypertension: Secondary | ICD-10-CM | POA: Insufficient documentation

## 2019-11-09 DIAGNOSIS — R931 Abnormal findings on diagnostic imaging of heart and coronary circulation: Secondary | ICD-10-CM | POA: Diagnosis not present

## 2019-11-09 DIAGNOSIS — R9430 Abnormal result of cardiovascular function study, unspecified: Secondary | ICD-10-CM | POA: Diagnosis not present

## 2019-11-09 DIAGNOSIS — R0609 Other forms of dyspnea: Secondary | ICD-10-CM

## 2019-11-09 MED ORDER — IOHEXOL 350 MG/ML SOLN
80.0000 mL | Freq: Once | INTRAVENOUS | Status: AC | PRN
  Administered 2019-11-09: 80 mL via INTRAVENOUS

## 2019-11-09 MED ORDER — NITROGLYCERIN 0.4 MG SL SUBL
SUBLINGUAL_TABLET | SUBLINGUAL | Status: AC
  Administered 2019-11-09: 0.8 mg via SUBLINGUAL
  Filled 2019-11-09: qty 2

## 2019-11-09 MED ORDER — NITROGLYCERIN 0.4 MG SL SUBL: 0.8000 mg | SUBLINGUAL_TABLET | Freq: Once | SUBLINGUAL | Status: AC

## 2019-11-11 ENCOUNTER — Other Ambulatory Visit: Payer: Self-pay

## 2019-11-11 MED ORDER — ASPIRIN EC 81 MG PO TBEC
81.0000 mg | DELAYED_RELEASE_TABLET | Freq: Every day | ORAL | 12 refills | Status: DC
Start: 2019-11-11 — End: 2020-11-24

## 2019-11-11 NOTE — Progress Notes (Signed)
aspirin

## 2019-11-18 ENCOUNTER — Telehealth: Payer: Self-pay | Admitting: Cardiovascular Disease

## 2019-11-18 NOTE — Telephone Encounter (Signed)
Left message that we will change his appointment to MyChart Video (virtual) and that a consent would be sent to his MyChart and to ready this all the way to the bottom prior to the visit.

## 2019-11-18 NOTE — Telephone Encounter (Signed)
Per pt call stated he would like to make his appt on Monday a virtual visit if possible please give him a call back about this please.

## 2019-11-22 ENCOUNTER — Other Ambulatory Visit: Payer: Self-pay

## 2019-11-22 ENCOUNTER — Encounter: Payer: Self-pay | Admitting: Cardiovascular Disease

## 2019-11-22 ENCOUNTER — Telehealth (INDEPENDENT_AMBULATORY_CARE_PROVIDER_SITE_OTHER): Payer: BC Managed Care – PPO | Admitting: Cardiovascular Disease

## 2019-11-22 VITALS — BP 118/67 | HR 67 | Ht 71.0 in | Wt 200.0 lb

## 2019-11-22 DIAGNOSIS — I2510 Atherosclerotic heart disease of native coronary artery without angina pectoris: Secondary | ICD-10-CM | POA: Diagnosis not present

## 2019-11-22 NOTE — Patient Instructions (Signed)

## 2019-11-22 NOTE — Progress Notes (Signed)
Virtual Visit via Video Note   This visit type was conducted due to national recommendations for restrictions regarding the COVID-19 Pandemic (e.g. social distancing) in an effort to limit this patient's exposure and mitigate transmission in our community.  Due to his co-morbid illnesses, this patient is at least at moderate risk for complications without adequate follow up.  This format is felt to be most appropriate for this patient at this time.  All issues noted in this document were discussed and addressed.  A limited physical exam was performed with this format.  Please refer to the patient's chart for his consent to telehealth for [facility].  Date:  11/22/2019   ID:  Jason Duarte, DOB 24-Apr-1960, MRN [id number]  Patient Location: Home Provider Location: Office/Clinic  PCP:  Shon Baton, MD  Cardiologist:  Lauree Chandler, MD  Electrophysiologist:  None   Evaluation Performed:  Follow-Up Visit  Chief Complaint:  Follow up- CAD  History of Present Illness:    Jason Duarte is a 59 y.o. male with history of HTN, hyperlipidemia and sleep apnea who is here today for follow up via video visit due to the Covid 19 pandemic. I saw him as a new consult 10/11/19 for evaluation of abnormal calcium score in primary care. (Calcium score 589). He is very active. He has no prior cardiac events. He has no chest pain but has dyspnea at times with exertion. He had a recent hernia surgery and has been less active lately. No LE edema. I arranged an echocardiogram and a coronary CTA. Echo 10/22/19 with LVEF=55-60%. No valve disease. Coronary CTA 11/09/19 with miild plaque in the LAD and RCA but no obstructive disease.   The patient denies chest pain, dyspnea, palpitations, dizziness, near syncope or syncope. No lower extremity edema.   The patient does not have symptoms concerning for COVID-19 infection (fever, chills, cough, or new shortness of breath).    Past Medical History:   Diagnosis Date  . Abdominal pain   . Bilateral inguinal herniae s/p lap repair w mesh 08/06/2019 08/06/2019  . Colon polyps   . DDD (degenerative disc disease), lumbar   . Excessive daytime sleepiness 12/06/2015  . Family history of early CAD   . Floaters   . Hyperlipidemia   . Hypersomnia with sleep apnea 12/06/2015  . Hypertriglyceridemia   . Leukopenia   . OA (osteoarthritis) of ankle   . Overweight   . Scrotal pain   . Skin mole   . Sleep apnea   . Snoring    Past Surgical History:  Procedure Laterality Date  . INGUINAL HERNIA REPAIR Right 08/06/2019   Procedure: LAPAROSCOPIC BILATERAL HERNIA REPAIR WITH MESH, TAP BLOCK;  Surgeon: Makyi Boston, MD;  Location: [facility];  Service: General;  Laterality: Right;  . LUMBAR [place] SURGERY  2017  . removal foreign body leg Left 2013  . VASECTOMY  2002  . VASECTOMY REVERSAL  2008     Current Meds  Medication Sig  . aspirin EC 81 MG tablet Take 1 tablet (81 mg total) by mouth daily. Swallow whole.  . Glucosamine-Chondroit-Vit C-Mn (GLUCOSAMINE 1500 COMPLEX) CAPS Take 1 capsule by mouth daily.  Marland Kitchen losartan (COZAAR) 25 MG tablet Take 1 tablet (25 mg total) by mouth daily.  . Multiple Vitamins-Minerals (MULTIVITAMIN WITH MINERALS) tablet Take 1 tablet by mouth daily.  . rosuvastatin (CRESTOR) 10 MG tablet Take 10 mg by mouth at bedtime.      Allergies:   Cashew  nut oil and Poison ivy extract   Social History   Tobacco Use  . Smoking status: Former Smoker    Types: Cigars    Quit date: 02/25/2006    Years since quitting: 13.7  . Smokeless tobacco: Never Used  . Tobacco comment: rare cigar  Vaping Use  . Vaping Use: Never used  Substance Use Topics  . Alcohol use: Yes    Alcohol/week: 7.0 standard drinks    Types: 7 Glasses of wine per week    Comment: occas.  . Drug use: No     Family Hx: The patient's family history includes CAD in his father; Heart failure in his maternal grandfather; Obesity in his mother; Prostate  cancer in his father. There is no history of Colon cancer, Esophageal cancer, Rectal cancer, or Stomach cancer.  ROS:   Please see the history of present illness.    All other systems reviewed and are negative.   Prior CV studies:   The following studies were reviewed today:  Echo 10/22/19:  1. Left ventricular ejection fraction, by estimation, is 55 to 60%. The  left ventricle has normal function. The left ventricle has no regional  wall motion abnormalities. Left ventricular diastolic parameters were  normal.  2. Right ventricular systolic function is normal. The right ventricular  size is normal.  3. The mitral valve is normal in structure. Trivial mitral valve  regurgitation. No evidence of mitral stenosis.  4. The aortic valve is tricuspid. Aortic valve regurgitation is not  visualized. No aortic stenosis is present.  5. The inferior vena cava is normal in size with greater than 50%  respiratory variability, suggesting right atrial pressure of 3 mmHg.   Cardiac CTA 11/09/19: Coronary Arteries:  Normal coronary origin.  Right dominance.  Left main: The left main is a large caliber vessel with a normal take off from the left coronary cusp that bifurcates to form a left anterior descending artery and a left circumflex artery. There is no plaque or stenosis.  Left anterior descending artery: The proximal LAD contains mild calcified plaque (25-49%). The mid LAD contains mild calcified plaque (25-49%). The distal LAD contains minimal calcified plaque (<25%). The LAD gives off 2 patent diagonal branches.  Left circumflex artery: The LCX is non-dominant and patent with no evidence of plaque or stenosis. The LCX gives off 2 patent obtuse marginal branches.  Right coronary artery: The RCA is dominant with normal take off from the right coronary cusp. The proximal RCA contains minimal calcified plaque (<25%). The distal RCA contains minimal calcified plaque (<25%). The  RCA terminates as a PDA and right posterolateral branch without evidence of plaque or stenosis.  Right Atrium: Right atrial size is within normal limits.  Right Ventricle: The right ventricular cavity is within normal limits.  Left Atrium: Left atrial size is normal in size with no left atrial appendage filling defect.  Left Ventricle: The ventricular cavity size is within normal limits. There are no stigmata of prior infarction. There is no abnormal filling defect.  Pulmonary arteries: Normal in size without proximal filling defect.  Pulmonary veins: Normal pulmonary venous drainage.  Pericardium: Normal thickness with no significant effusion or calcium present.  Cardiac valves: The aortic valve is trileaflet without significant calcification. The mitral valve is normal structure without significant calcification.  Aorta: Normal caliber with no significant disease.  Extra-cardiac findings: See attached radiology report for non-cardiac structures.  IMPRESSION: 1. Coronary calcium score of 539. This was 96th percentile for age  and sex matched controls.  2. Normal coronary origin with right dominance.  3. Mild calcified plaque (25-49%) in the LAD.  4. Minimal calcified plaque (<25%) in the RCA.  RECOMMENDATIONS: 1. Mild non-obstructive CAD (25-49%). Consider preventive therapy and risk factor modification.  Labs/Other Tests and Data Reviewed:    EKG:  No ECG reviewed.  Recent Labs: 07/30/2019: Hemoglobin 15.2; Platelets 167 10/11/2019: BUN 12; Creatinine, Ser 1.00; Potassium 4.6; Sodium 139   Recent Lipid Panel No results found for: CHOL, TRIG, HDL, CHOLHDL, LDLCALC, LDLDIRECT  Wt Readings from Last 3 Encounters:  11/22/19 200 lb (90.7 kg)  10/11/19 200 lb 6.4 oz (90.9 kg)  08/06/19 195 lb 1.7 oz (88.5 kg)     Objective:    Vital Signs:  BP 118/67   Pulse 67   Ht 5\' 11"  (1.803 m)   Wt 200 lb (90.7 kg)   BMI 27.89 kg/m    VITAL SIGNS:   reviewed GEN:  no acute distress  ASSESSMENT & PLAN:    1 CAD without angina: Mild CAD by coronary CTA September 2021. He has no chest pain or symptoms consistent with angina. LV systolic function normal by echo. Will continue ASA 81 mg daily and Crestor 10 mg daily. Lipids followed in primary care.    COVID-19 Education: The signs and symptoms of COVID-19 were discussed with the patient and how to seek care for testing (follow up with PCP or arrange E-visit).  The importance of social distancing was discussed today.  Time:   Today, I have spent 20 minutes with the patient with telehealth technology discussing the above problems.     Medication Adjustments/Labs and Tests Ordered: Current medicines are reviewed at length with the patient today.  Concerns regarding medicines are outlined above.   Tests Ordered: No orders of the defined types were placed in this encounter.   Medication Changes: No orders of the defined types were placed in this encounter.   Disposition:  Follow up in 1 year(s)  Signed, Lauree Chandler, MD  11/22/2019 3:34 PM    [place]

## 2020-01-19 ENCOUNTER — Other Ambulatory Visit: Payer: Self-pay

## 2020-01-19 ENCOUNTER — Ambulatory Visit (INDEPENDENT_AMBULATORY_CARE_PROVIDER_SITE_OTHER): Payer: BC Managed Care – PPO | Admitting: Podiatry

## 2020-01-19 ENCOUNTER — Encounter: Payer: Self-pay | Admitting: Podiatry

## 2020-01-19 DIAGNOSIS — B351 Tinea unguium: Secondary | ICD-10-CM | POA: Diagnosis not present

## 2020-01-19 DIAGNOSIS — M79674 Pain in right toe(s): Secondary | ICD-10-CM | POA: Diagnosis not present

## 2020-01-19 DIAGNOSIS — Z79899 Other long term (current) drug therapy: Secondary | ICD-10-CM

## 2020-01-19 MED ORDER — TERBINAFINE HCL 250 MG PO TABS: 250.0000 mg | ORAL_TABLET | Freq: Every day | ORAL | 0 refills | Status: DC

## 2020-01-19 NOTE — Progress Notes (Signed)
   HPI: 59 y.o. male presenting today as a new patient for evaluation of discoloration and thickening to the right hallux nail plate.  Patient states that several years ago he dropped a pallet on his right hallux nail plate.  It fell off but grew back completely and normal.  Patient has noticed over the past few years that he is had increased discoloration with detachment of the distal part of the nail.  He has not done anything for treatment.  He presents for further treatment evaluation  Past Medical History:  Diagnosis Date  . Abdominal pain   . Bilateral inguinal herniae s/p lap repair w mesh 08/06/2019 08/06/2019  . Colon polyps   . DDD (degenerative disc disease), lumbar   . Excessive daytime sleepiness 12/06/2015  . Family history of early CAD   . Floaters   . Hyperlipidemia   . Hypersomnia with sleep apnea 12/06/2015  . Hypertriglyceridemia   . Leukopenia   . OA (osteoarthritis) of ankle   . Overweight   . Scrotal pain   . Skin mole   . Sleep apnea   . Snoring      Physical Exam: General: The patient is alert and oriented x3 in no acute distress.  Dermatology: Skin is warm, dry and supple bilateral lower extremities. Negative for open lesions or macerations.  Hyperkeratotic dystrophic thickened nail noted to the right hallux nail plate.  The distal portion of the nail has thickening with onychauxis and detachment.  Findings consistent with onychomycosis of the toenail  Vascular: Palpable pedal pulses bilaterally. No edema or erythema noted. Capillary refill within normal limits.  Neurological: Epicritic and protective threshold grossly intact bilaterally.   Musculoskeletal Exam: Range of motion within normal limits to all pedal and ankle joints bilateral. Muscle strength 5/5 in all groups bilateral.   Assessment: 1.  Onychomycosis of toenail right hallux nail plate   Plan of Care:  1. Patient evaluated.  2.  Discussed different treatment options for the patient  including topical, oral, and laser therapy. 3.  Today were going to pursue oral antifungal Lamisil. 4.  Order placed for hepatic function panel 5.  Prescription for Lamisil 250 mg #90 daily 6.  Recommend OTC topical antifungal 7.  Return to clinic as needed      Edrick Kins, DPM [facility]  Dr. Edrick Kins, DPM    [address], [place] [address]                Office [phone]  Fax [phone]

## 2020-01-20 LAB — HEPATIC FUNCTION PANEL
AG Ratio: 1.8 (calc) (ref 1.0–2.5)
ALT: 21 U/L (ref 9–46)
AST: 19 U/L (ref 10–35)
Albumin: 4.2 g/dL (ref 3.6–5.1)
Alkaline phosphatase (APISO): 44 U/L (ref 35–144)
Bilirubin, Direct: 0.2 mg/dL (ref 0.0–0.2)
Globulin: 2.3 g/dL (calc) (ref 1.9–3.7)
Indirect Bilirubin: 0.6 mg/dL (calc) (ref 0.2–1.2)
Total Bilirubin: 0.8 mg/dL (ref 0.2–1.2)
Total Protein: 6.5 g/dL (ref 6.1–8.1)

## 2020-01-28 DIAGNOSIS — G4733 Obstructive sleep apnea (adult) (pediatric): Secondary | ICD-10-CM | POA: Diagnosis not present

## 2020-04-20 ENCOUNTER — Other Ambulatory Visit: Payer: Self-pay | Admitting: Podiatry

## 2020-04-20 NOTE — Telephone Encounter (Signed)
Please advise 

## 2020-07-29 DIAGNOSIS — T783XXA Angioneurotic edema, initial encounter: Secondary | ICD-10-CM | POA: Diagnosis not present

## 2020-07-29 DIAGNOSIS — T63481A Toxic effect of venom of other arthropod, accidental (unintentional), initial encounter: Secondary | ICD-10-CM | POA: Diagnosis not present

## 2020-08-17 DIAGNOSIS — E781 Pure hyperglyceridemia: Secondary | ICD-10-CM | POA: Diagnosis not present

## 2020-08-17 DIAGNOSIS — Z125 Encounter for screening for malignant neoplasm of prostate: Secondary | ICD-10-CM | POA: Diagnosis not present

## 2020-08-17 DIAGNOSIS — Z0000 Encounter for general adult medical examination without abnormal findings: Secondary | ICD-10-CM | POA: Diagnosis not present

## 2020-08-17 DIAGNOSIS — E785 Hyperlipidemia, unspecified: Secondary | ICD-10-CM | POA: Diagnosis not present

## 2020-08-24 DIAGNOSIS — Z0000 Encounter for general adult medical examination without abnormal findings: Secondary | ICD-10-CM | POA: Diagnosis not present

## 2020-08-24 DIAGNOSIS — Z1389 Encounter for screening for other disorder: Secondary | ICD-10-CM | POA: Diagnosis not present

## 2020-08-24 DIAGNOSIS — R82998 Other abnormal findings in urine: Secondary | ICD-10-CM | POA: Diagnosis not present

## 2020-08-24 DIAGNOSIS — Z1212 Encounter for screening for malignant neoplasm of rectum: Secondary | ICD-10-CM | POA: Diagnosis not present

## 2020-08-24 DIAGNOSIS — Z1331 Encounter for screening for depression: Secondary | ICD-10-CM | POA: Diagnosis not present

## 2020-10-22 ENCOUNTER — Other Ambulatory Visit: Payer: Self-pay | Admitting: Cardiovascular Disease

## 2020-11-23 DIAGNOSIS — I2510 Atherosclerotic heart disease of native coronary artery without angina pectoris: Secondary | ICD-10-CM | POA: Insufficient documentation

## 2020-11-23 DIAGNOSIS — I10 Essential (primary) hypertension: Secondary | ICD-10-CM | POA: Insufficient documentation

## 2020-11-23 DIAGNOSIS — E7800 Pure hypercholesterolemia, unspecified: Secondary | ICD-10-CM | POA: Insufficient documentation

## 2020-11-23 NOTE — Progress Notes (Signed)
Cardiology Office Note:    Date:  11/24/2020   ID:  Jason Duarte, DOB September 10, 1960, MRN [id number]  PCP:  Shon Baton, MD   [facility] Providers Cardiologist:  Lauree Chandler, MD     Referring MD: Shon Baton, MD   Chief Complaint:  F/u for CAD    Patient Profile:   Jason Duarte is a 60 y.o. male with:  Coronary artery disease  Non-obs by Cor CTA in 9/21 CAC score 539 Hypertension  Hyperlipidemia  OSA  FHx of CAD   Prior CV studies: Coronary CTA 11/09/19 IMPRESSION: 1. Coronary calcium score of 539. This was 96th percentile for age and sex matched controls. 2. Normal coronary origin with right dominance. 3. Mild calcified plaque (25-49%) in the LAD. 4. Minimal calcified plaque (<25%) in the RCA. RECOMMENDATIONS: 1. Mild non-obstructive CAD (25-49%). Consider preventive therapy and risk factor modification.  Echocardiogram 10/22/19 EF 55-60, no RWMA, normal diastolic function, normal RVSF, trivial MR  History of Present Illness: Jason Duarte was last seen by Dr. Angelena Form in 9/21 via Telemedicine.  He returns for f/u.  He is here alone.  He has been doing well without chest pain, shortness of breath, syncope, orthopnea, leg edema.        Past Medical History:  Diagnosis Date   Abdominal pain    Bilateral inguinal herniae s/p lap repair w mesh 08/06/2019 08/06/2019   Colon polyps    DDD (degenerative disc disease), lumbar    Excessive daytime sleepiness 12/06/2015   Family history of early CAD    Floaters    Hyperlipidemia    Hypersomnia with sleep apnea 12/06/2015   Hypertriglyceridemia    Leukopenia    OA (osteoarthritis) of ankle    Overweight    Scrotal pain    Skin mole    Sleep apnea    Snoring    Current Medications: Current Meds  Medication Sig   EPINEPHrine 0.3 mg/0.3 mL IJ SOAJ injection Inject into the muscle.   Glucosamine-Chondroit-Vit C-Mn (GLUCOSAMINE 1500 COMPLEX) CAPS Take 1 capsule by mouth daily.   losartan (COZAAR) 25 MG  tablet TAKE 1 TABLET BY MOUTH EVERY DAY   Multiple Vitamins-Minerals (MULTIVITAMIN WITH MINERALS) tablet Take 1 tablet by mouth daily.   rosuvastatin (CRESTOR) 10 MG tablet Take 10 mg by mouth at bedtime.     Allergies:   Cashew nut oil and Poison ivy extract   Social History   Tobacco Use   Smoking status: Former    Types: Cigars    Quit date: 02/25/2006    Years since quitting: 14.7   Smokeless tobacco: Never   Tobacco comments:    rare cigar  Vaping Use   Vaping Use: Never used  Substance Use Topics   Alcohol use: Yes    Alcohol/week: 7.0 standard drinks    Types: 7 Glasses of wine per week    Comment: occas.   Drug use: No    Family Hx: The patient's family history includes CAD in his father; Heart failure in his maternal grandfather; Obesity in his mother; Prostate cancer in his father. There is no history of Colon cancer, Esophageal cancer, Rectal cancer, or Stomach cancer.  Review of Systems  Cardiovascular:  Negative for claudication.    EKGs/Labs/Other Test Reviewed:    EKG:  EKG is ordered today.  The ekg ordered today demonstrates NSR, HR 65, normal axis, no ST-T wave changes, QTC 393  Recent Labs: 01/19/2020: ALT 21   Recent Lipid Panel No  results found for: CHOL, TRIG, HDL, LDLCALC, LDLDIRECT  Labs obtained through [facility] - personally reviewed and interpreted: 08/17/2020: Total cholesterol 182, HDL 43, LDL 101, triglycerides 188, A1c 5.1, K+ 4.4, ALT 39, TSH 2.17  Risk Assessment/Calculations:          Physical Exam:    VS:  BP 104/68   Pulse 65   Ht 5\' 11"  (1.803 m)   Wt 202 lb (91.6 kg)   SpO2 98%   BMI 28.17 kg/m     Wt Readings from Last 3 Encounters:  11/24/20 202 lb (91.6 kg)  11/22/19 200 lb (90.7 kg)  10/11/19 200 lb 6.4 oz (90.9 kg)    Constitutional:      Appearance: Healthy appearance. Not in distress.  Neck:     Vascular: No carotid bruit. JVD normal.  Pulmonary:     Effort: Pulmonary effort is normal.     Breath sounds: No  wheezing. No rales.  Cardiovascular:     Normal rate. Regular rhythm. Normal S1. Normal S2.      Murmurs: There is no murmur.  Edema:    Peripheral edema absent.  Abdominal:     Palpations: Abdomen is soft. There is no hepatomegaly.  Skin:    General: Skin is warm and dry.  Neurological:     General: No focal deficit present.     Mental Status: Alert and oriented to person, place and time.     Cranial Nerves: Cranial nerves are intact.     ASSESSMENT & PLAN:   1. Coronary artery disease involving native coronary artery of native heart without angina pectoris Mild nonobstructive CAD by coronary CTA in 2021.  He is doing well without anginal symptoms.  He remains on aspirin, rosuvastatin.  Follow-up in 1 year.  2. Essential hypertension Blood pressure is well controlled on current therapy which includes losartan.  3. Pure hypercholesterolemia Recent LDL 101.  Goal LDL with CAD is <70.  We discussed increasing the dose of his rosuvastatin.  However, he prefers to see what his lipid panel shows at his next physical exam.  We will discuss this further with his primary care provider.      Dispo:  Return in about 1 year (around 11/24/2021) for Routine Follow Up w/ Dr. Angelena Form.   Medication Adjustments/Labs and Tests Ordered: Current medicines are reviewed at length with the patient today.  Concerns regarding medicines are outlined above.  Tests Ordered: Orders Placed This Encounter  Procedures   EKG 12-Lead   Medication Changes: No orders of the defined types were placed in this encounter.  Signed, Richardson Dopp, PA-C  11/24/2020 8:45 AM    [facility] [place], [place], [place]  [address] Phone: [phone]; Fax: [phone]

## 2020-11-24 ENCOUNTER — Ambulatory Visit (INDEPENDENT_AMBULATORY_CARE_PROVIDER_SITE_OTHER): Payer: BC Managed Care – PPO | Admitting: Physician Assistant

## 2020-11-24 ENCOUNTER — Encounter: Payer: Self-pay | Admitting: Physician Assistant

## 2020-11-24 ENCOUNTER — Other Ambulatory Visit: Payer: Self-pay

## 2020-11-24 VITALS — BP 104/68 | HR 65 | Ht 71.0 in | Wt 202.0 lb

## 2020-11-24 DIAGNOSIS — I10 Essential (primary) hypertension: Secondary | ICD-10-CM

## 2020-11-24 DIAGNOSIS — I2510 Atherosclerotic heart disease of native coronary artery without angina pectoris: Secondary | ICD-10-CM

## 2020-11-24 DIAGNOSIS — E7800 Pure hypercholesterolemia, unspecified: Secondary | ICD-10-CM | POA: Diagnosis not present

## 2020-11-24 NOTE — Patient Instructions (Signed)
Medication Instructions:   Your physician recommends that you continue on your current medications as directed. Please refer to the Current Medication list given to you today.  *If you need a refill on your cardiac medications before your next appointment, please call your pharmacy*   Lab Work:  -NONE  If you have labs (blood work) drawn today and your tests are completely normal, you will receive your results only by: [place] (if you have MyChart) OR A paper copy in the mail If you have any lab test that is abnormal or we need to change your treatment, we will call you to review the results.   Testing/Procedures:  -NONE-  Follow-Up: At [facility], you and your health needs are our priority.  As part of our continuing mission to provide you with exceptional heart care, we have created designated Provider Care Teams.  These Care Teams include your primary Cardiologist (physician) and Advanced Practice Providers (APPs -  Physician Assistants and Nurse Practitioners) who all work together to provide you with the care you need, when you need it.  We recommend signing up for the patient portal called "MyChart".  Sign up information is provided on this After Visit Summary.  MyChart is used to connect with patients for Virtual Visits (Telemedicine).  Patients are able to view lab/test results, encounter notes, upcoming appointments, etc.  Non-urgent messages can be sent to your provider as well.   To learn more about what you can do with MyChart, go to [email or web address].    Your next appointment:   1 year(s)  The format for your next appointment:   In Person  Provider:   Lauree Chandler, MD   Other Instructions Your physician wants you to follow-up in: 1 year with Dr. Angelena Form.  You will receive a reminder letter in the mail two months in advance. If you don't receive a letter, please call our office to schedule the follow-up appointment.

## 2021-01-19 ENCOUNTER — Other Ambulatory Visit: Payer: Self-pay | Admitting: Cardiovascular Disease

## 2021-01-31 DIAGNOSIS — G4733 Obstructive sleep apnea (adult) (pediatric): Secondary | ICD-10-CM | POA: Diagnosis not present

## 2021-03-03 DIAGNOSIS — G4733 Obstructive sleep apnea (adult) (pediatric): Secondary | ICD-10-CM | POA: Diagnosis not present

## 2021-04-03 DIAGNOSIS — G4733 Obstructive sleep apnea (adult) (pediatric): Secondary | ICD-10-CM | POA: Diagnosis not present

## 2021-05-08 ENCOUNTER — Other Ambulatory Visit: Payer: Self-pay | Admitting: Cardiovascular Disease

## 2021-05-08 DIAGNOSIS — L723 Sebaceous cyst: Secondary | ICD-10-CM | POA: Diagnosis not present

## 2021-08-25 ENCOUNTER — Other Ambulatory Visit: Payer: Self-pay | Admitting: Cardiovascular Disease

## 2021-09-07 DIAGNOSIS — I10 Essential (primary) hypertension: Secondary | ICD-10-CM | POA: Diagnosis not present

## 2021-09-07 DIAGNOSIS — Z125 Encounter for screening for malignant neoplasm of prostate: Secondary | ICD-10-CM | POA: Diagnosis not present

## 2021-09-07 DIAGNOSIS — E785 Hyperlipidemia, unspecified: Secondary | ICD-10-CM | POA: Diagnosis not present

## 2021-09-17 DIAGNOSIS — Z0000 Encounter for general adult medical examination without abnormal findings: Secondary | ICD-10-CM | POA: Diagnosis not present

## 2021-09-18 DIAGNOSIS — R82998 Other abnormal findings in urine: Secondary | ICD-10-CM | POA: Diagnosis not present

## 2021-09-18 DIAGNOSIS — Z0000 Encounter for general adult medical examination without abnormal findings: Secondary | ICD-10-CM | POA: Diagnosis not present

## 2021-09-18 DIAGNOSIS — Z1331 Encounter for screening for depression: Secondary | ICD-10-CM | POA: Diagnosis not present

## 2021-09-18 DIAGNOSIS — I2584 Coronary atherosclerosis due to calcified coronary lesion: Secondary | ICD-10-CM | POA: Diagnosis not present

## 2021-09-20 ENCOUNTER — Other Ambulatory Visit: Payer: Self-pay | Admitting: Physician Assistant

## 2021-10-22 ENCOUNTER — Other Ambulatory Visit: Payer: Self-pay | Admitting: Physician Assistant

## 2021-11-15 ENCOUNTER — Other Ambulatory Visit: Payer: Self-pay | Admitting: Cardiovascular Disease

## 2021-12-05 IMAGING — CT CT HEART MORP W/ CTA COR W/ SCORE W/ CA W/CM &/OR W/O CM
4 of 7 series · 8 of 20 positions shown, 9 images · non-contrast
Comparison: None.
COMPARISON: None.

Addendum:
EXAM:
OVER-READ INTERPRETATION  CT CHEST

The following report is an over-read performed by radiologist Dr.
Kiri Jim [REDACTED] on 11/09/2019. This
over-read does not include interpretation of cardiac or coronary
anatomy or pathology. The coronary calcium score/coronary CTA
interpretation by the cardiologist is attached.
CLINICAL DATA: Shortness of breath
Cardiac/Coronary CTA
TECHNIQUE: The patient was scanned on a Phillips Force scanner. A 100 kV
prospective scan was triggered in the descending thoracic aorta at
111 HU's. Axial non-contrast 3 mm slices were carried out through
the heart. The data set was analyzed on a dedicated work station and
scored using the Agatson method. Gantry rotation speed was 250 msecs
and collimation was .6 mm. No beta blockade and 0.8 mg of sl NTG was
given. The 3D data set was reconstructed in 5% intervals of the
35-75 % of the R-R cycle. Diastolic phases were analyzed on a
dedicated work station using MPR, MIP and VRT modes. The patient
received 80 cc of contrast.

[Series 6: best diast 73 % · axial · 0.39mm/px · z∈[+1156,+1196]mm · 2 of 303 slices shown, 3 images]
[im 101/303  vessel]
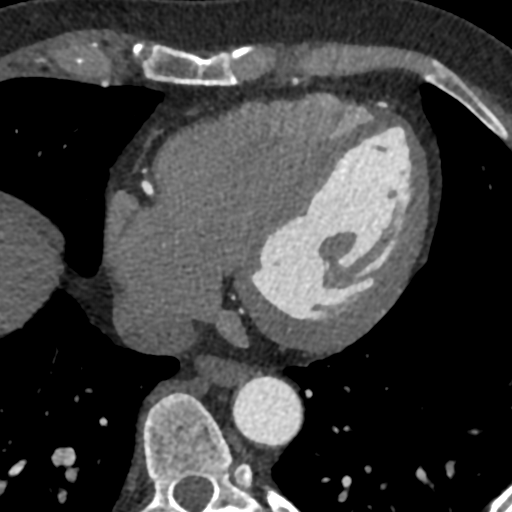
[im 101/303  lung]
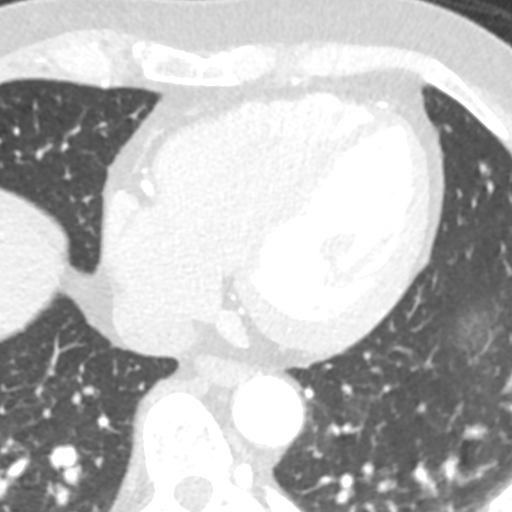
[im 202/303  vessel]
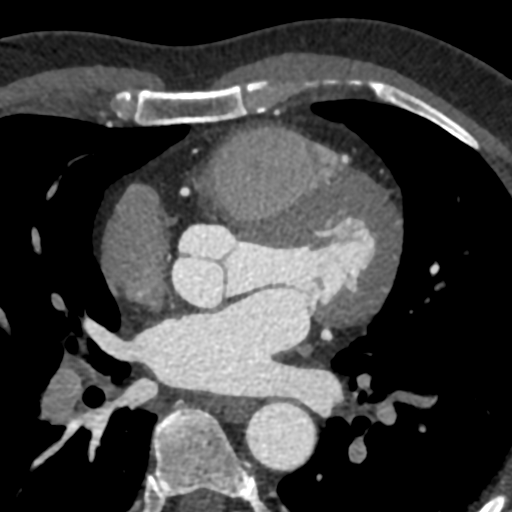

[Series 7: best syst 34 % · axial · 0.39mm/px · z∈[+1156,+1196]mm · 2 of 303 slices shown]
[im 101/303  vessel]
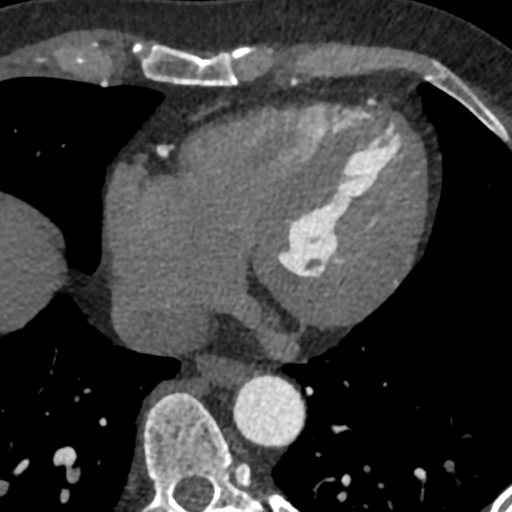
[im 202/303  vessel]
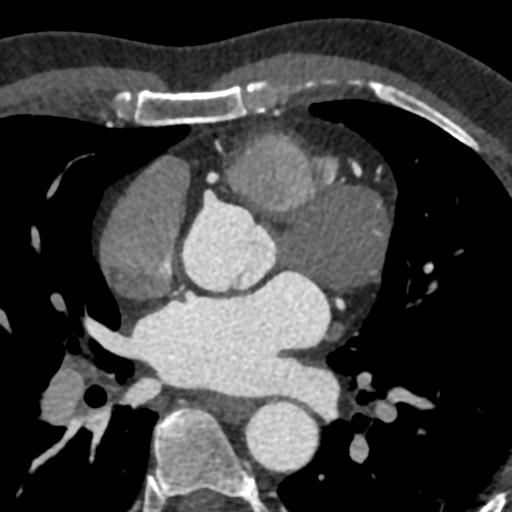

[Series 8: ts diast sharp 73 % · axial · 0.39mm/px · z∈[+1156,+1196]mm · 2 of 303 slices shown]
[im 101/303  lung]
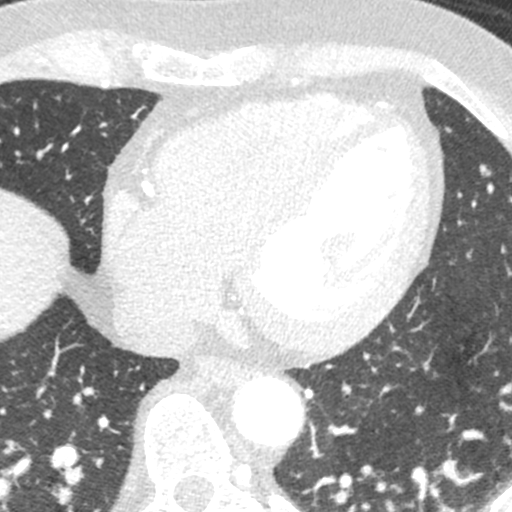
[im 202/303  lung]
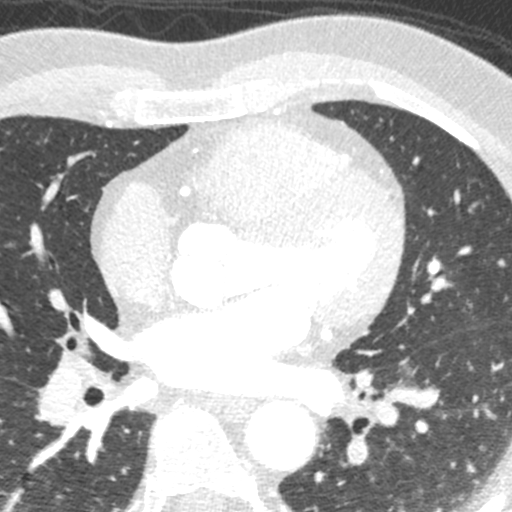

[Series 9: ts syst sharp 34 % · axial · 0.39mm/px · z∈[+1156,+1196]mm · 2 of 303 slices shown]
[im 101/303  lung]
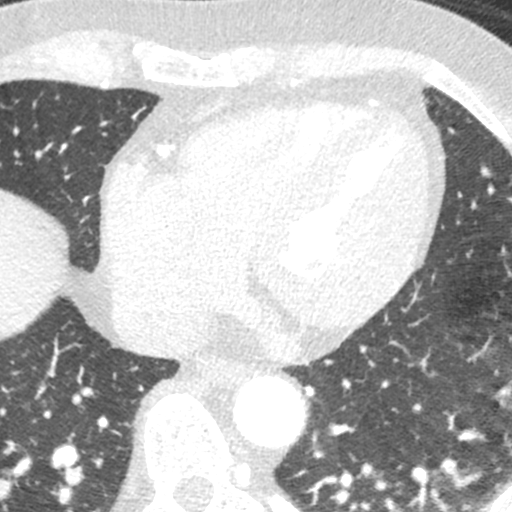
[im 202/303  lung]
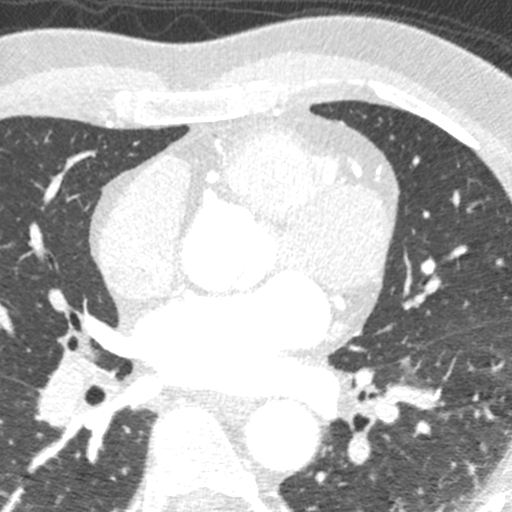

[8 of 20 positions shown; findings below may reference images not displayed]

FINDINGS: Within the visualized portions of the thorax there are no suspicious
appearing pulmonary nodules or masses, there is no acute
consolidative airspace disease, no pleural effusions, no
pneumothorax and no lymphadenopathy. Visualized portions of the
upper abdomen are unremarkable. There are no aggressive appearing
lytic or blastic lesions noted in the visualized portions of the
skeleton.
IMPRESSION: No significant incidental noncardiac findings are noted.
FINDINGS: Image quality: Excellent.

Noise artifact is: Limited.

Coronary Arteries:  Normal coronary origin.  Right dominance.

Left main: The left main is a large caliber vessel with a normal
take off from the left coronary cusp that bifurcates to form a left
anterior descending artery and a left circumflex artery. There is no
plaque or stenosis.

Left anterior descending artery: The proximal LAD contains mild
calcified plaque (25-49%). The mid LAD contains mild calcified
plaque (25-49%). The distal LAD contains minimal calcified plaque
(<25%). The LAD gives off 2 patent diagonal branches.

Left circumflex artery: The LCX is non-dominant and patent with no
evidence of plaque or stenosis. The LCX gives off 2 patent obtuse
marginal branches.

Right coronary artery: The RCA is dominant with normal take off from
the right coronary cusp. The proximal RCA contains minimal calcified
plaque (<25%). The distal RCA contains minimal calcified plaque
(<25%). The RCA terminates as a PDA and right posterolateral branch
without evidence of plaque or stenosis.

Right Atrium: Right atrial size is within normal limits.

Right Ventricle: The right ventricular cavity is within normal
limits.

Left Atrium: Left atrial size is normal in size with no left atrial
appendage filling defect.

Left Ventricle: The ventricular cavity size is within normal limits.
There are no stigmata of prior infarction. There is no abnormal
filling defect.

Pulmonary arteries: Normal in size without proximal filling defect.

Pulmonary veins: Normal pulmonary venous drainage.

Pericardium: Normal thickness with no significant effusion or
calcium present.

Cardiac valves: The aortic valve is trileaflet without significant
calcification. The mitral valve is normal structure without
significant calcification.

Aorta: Normal caliber with no significant disease.

Extra-cardiac findings: See attached radiology report for
non-cardiac structures.
IMPRESSION: 1. Coronary calcium score of 539. This was 96th percentile for age
and sex matched controls.

2. Normal coronary origin with right dominance.

3. Mild calcified plaque (25-49%) in the LAD.

4. Minimal calcified plaque (<25%) in the RCA.

RECOMMENDATIONS:
1. Mild non-obstructive CAD (25-49%). Consider preventive therapy
and risk factor modification.

*** End of Addendum ***
EXAM:
OVER-READ INTERPRETATION  CT CHEST

The following report is an over-read performed by radiologist Dr.
Kiri Jim [REDACTED] on 11/09/2019. This
over-read does not include interpretation of cardiac or coronary
anatomy or pathology. The coronary calcium score/coronary CTA
interpretation by the cardiologist is attached.
FINDINGS: Within the visualized portions of the thorax there are no suspicious
appearing pulmonary nodules or masses, there is no acute
consolidative airspace disease, no pleural effusions, no
pneumothorax and no lymphadenopathy. Visualized portions of the
upper abdomen are unremarkable. There are no aggressive appearing
lytic or blastic lesions noted in the visualized portions of the
skeleton.
IMPRESSION: No significant incidental noncardiac findings are noted.

## 2021-12-07 ENCOUNTER — Other Ambulatory Visit: Payer: Self-pay | Admitting: Cardiovascular Disease

## 2021-12-11 ENCOUNTER — Other Ambulatory Visit: Payer: Self-pay | Admitting: Cardiovascular Disease

## 2021-12-20 ENCOUNTER — Other Ambulatory Visit: Payer: Self-pay | Admitting: Cardiovascular Disease

## 2022-01-13 DIAGNOSIS — G4733 Obstructive sleep apnea (adult) (pediatric): Secondary | ICD-10-CM | POA: Diagnosis not present

## 2022-01-31 NOTE — Progress Notes (Signed)
   I, Jason Duarte, LAT, ATC acting as a scribe for [name], MD.  Subjective:    CC: R thumb pain  HPI: Pt is a 61 y/o male c/o R thumb pain ongoing since the summer and possibly over worked the R thumb pulling apart a deck. Pt has other fingers that previously triggered and resolved on their own. Pt notes the R thumb is triggering. Pt locates pain to the 1st MCP joint on his R hand w/ a nodule present.   R thumb swelling: yes Grip strength: diminished- dropping his phone Aggravates: any gripping motions, pushing the lawn mower/leaf sucker Treatments tried: thumb brace, stretches  Pertinent review of Systems: No fevers or chills  Relevant historical information: History of prior trigger finger.  Did not require surgery.   Objective:    Vitals:   02/01/22 0953  BP: (!) 178/104  Pulse: 69  SpO2: 97%   General: Well Developed, well nourished, and in no acute distress.   MSK: Right thumb: Normal-appearing Tender palpation palmar MCP. Normal hand and thumb motion. Triggering present with flexion of IP joint. Intact strength.   Lab and Radiology Results  Procedure: Real-time Ultrasound Guided Injection of right first A1 pulley tendon sheath at MCP. (Trigger thumb injection) Device: Philips Affiniti 50G Images permanently stored and available for review in PACS Verbal informed consent obtained.  Discussed risks and benefits of procedure. Warned about infection, bleeding, hyperglycemia damage to structures among others. Patient expresses understanding and agreement Time-out conducted.   Noted no overlying erythema, induration, or other signs of local infection.   Skin prepped in a sterile fashion.   Local anesthesia: Topical Ethyl chloride.   With sterile technique and under real time ultrasound guidance: 0.5 mL of 80 mg/mL Depo-Medrol solution and 0.5 mL of lidocaine injected into tendon sheath at first A1 pulley. Fluid seen entering the tendon sheath.   Completed  without difficulty   Pain immediately resolved suggesting accurate placement of the medication.   Advised to call if fevers/chills, erythema, induration, drainage, or persistent bleeding.   Images permanently stored and available for review in the ultrasound unit.  Impression: Technically successful ultrasound guided injection.        Impression and Recommendations:    Assessment and Plan: 61 y.o. male with right trigger thumb.  This has been ongoing for at least a few months not improving with difficult conservative management.  Plan for injection today.  Recommend Voltaren gel and double Band-Aid splint.  At the end of the visit he mentions some left tennis elbow.  Recommend Thera-Band flex bar.  Check back as needed.Marland Kitchen  PDMP not reviewed this encounter. Orders Placed This Encounter  Procedures   [place] LIMITED JOINT SPACE STRUCTURES UP RIGHT(NO LINKED CHARGES)    Order Specific Question:   Reason for Exam (SYMPTOM  OR DIAGNOSIS REQUIRED)    Answer:   rt trigger thumb    Order Specific Question:   Preferred imaging location?    Answer:   [facility]   Meds ordered this encounter  Medications   methylPREDNISolone acetate (DEPO-MEDROL) injection 80 mg    Discussed warning signs or symptoms. Please see discharge instructions. Patient expresses understanding.   The above documentation has been reviewed and is accurate and complete [name], M.D.

## 2022-02-01 ENCOUNTER — Ambulatory Visit: Payer: Self-pay

## 2022-02-01 ENCOUNTER — Ambulatory Visit (INDEPENDENT_AMBULATORY_CARE_PROVIDER_SITE_OTHER): Payer: BC Managed Care – PPO | Admitting: Family Medicine

## 2022-02-01 VITALS — BP 178/104 | HR 69 | Ht 71.0 in | Wt 206.0 lb

## 2022-02-01 DIAGNOSIS — M65311 Trigger thumb, right thumb: Secondary | ICD-10-CM | POA: Diagnosis not present

## 2022-02-01 MED ORDER — METHYLPREDNISOLONE ACETATE 80 MG/ML IJ SUSP
80.0000 mg | Freq: Once | INTRAMUSCULAR | Status: AC
  Administered 2022-02-01: 80 mg via INTRA_ARTICULAR

## 2022-02-01 NOTE — Patient Instructions (Addendum)
Thank you for coming in today.   You received an injection today. Seek immediate medical attention if the joint becomes red, extremely painful, or is oozing fluid.   Therband flexbar for tennis elbow.   Double bandaid splint for trigger thumb.   Please use Voltaren gel (Generic Diclofenac Gel) up to 4x daily for pain as needed.  This is available over-the-counter as both the name brand Voltaren gel and the generic diclofenac gel.

## 2022-09-19 DIAGNOSIS — E785 Hyperlipidemia, unspecified: Secondary | ICD-10-CM | POA: Diagnosis not present

## 2022-09-19 DIAGNOSIS — Z125 Encounter for screening for malignant neoplasm of prostate: Secondary | ICD-10-CM | POA: Diagnosis not present

## 2022-09-19 DIAGNOSIS — R7301 Impaired fasting glucose: Secondary | ICD-10-CM | POA: Diagnosis not present

## 2022-09-26 DIAGNOSIS — I2584 Coronary atherosclerosis due to calcified coronary lesion: Secondary | ICD-10-CM | POA: Diagnosis not present

## 2022-09-26 DIAGNOSIS — Z1331 Encounter for screening for depression: Secondary | ICD-10-CM | POA: Diagnosis not present

## 2022-09-26 DIAGNOSIS — I10 Essential (primary) hypertension: Secondary | ICD-10-CM | POA: Diagnosis not present

## 2022-09-26 DIAGNOSIS — R82998 Other abnormal findings in urine: Secondary | ICD-10-CM | POA: Diagnosis not present

## 2022-09-26 DIAGNOSIS — Z1212 Encounter for screening for malignant neoplasm of rectum: Secondary | ICD-10-CM | POA: Diagnosis not present

## 2022-09-26 DIAGNOSIS — Z0000 Encounter for general adult medical examination without abnormal findings: Secondary | ICD-10-CM | POA: Diagnosis not present

## 2022-10-15 DIAGNOSIS — G4733 Obstructive sleep apnea (adult) (pediatric): Secondary | ICD-10-CM | POA: Diagnosis not present

## 2022-11-07 DIAGNOSIS — F4323 Adjustment disorder with mixed anxiety and depressed mood: Secondary | ICD-10-CM | POA: Diagnosis not present

## 2022-11-14 DIAGNOSIS — F4323 Adjustment disorder with mixed anxiety and depressed mood: Secondary | ICD-10-CM | POA: Diagnosis not present

## 2022-11-28 DIAGNOSIS — F4323 Adjustment disorder with mixed anxiety and depressed mood: Secondary | ICD-10-CM | POA: Diagnosis not present

## 2022-12-12 DIAGNOSIS — F4323 Adjustment disorder with mixed anxiety and depressed mood: Secondary | ICD-10-CM | POA: Diagnosis not present

## 2023-01-28 DIAGNOSIS — F4323 Adjustment disorder with mixed anxiety and depressed mood: Secondary | ICD-10-CM | POA: Diagnosis not present

## 2023-04-02 DIAGNOSIS — F4323 Adjustment disorder with mixed anxiety and depressed mood: Secondary | ICD-10-CM | POA: Diagnosis not present

## 2023-04-10 DIAGNOSIS — F4323 Adjustment disorder with mixed anxiety and depressed mood: Secondary | ICD-10-CM | POA: Diagnosis not present

## 2023-05-23 DIAGNOSIS — F4323 Adjustment disorder with mixed anxiety and depressed mood: Secondary | ICD-10-CM | POA: Diagnosis not present

## 2023-06-06 DIAGNOSIS — F4323 Adjustment disorder with mixed anxiety and depressed mood: Secondary | ICD-10-CM | POA: Diagnosis not present

## 2023-06-20 DIAGNOSIS — F4323 Adjustment disorder with mixed anxiety and depressed mood: Secondary | ICD-10-CM | POA: Diagnosis not present

## 2023-07-04 DIAGNOSIS — F4323 Adjustment disorder with mixed anxiety and depressed mood: Secondary | ICD-10-CM | POA: Diagnosis not present

## 2023-09-17 DIAGNOSIS — L255 Unspecified contact dermatitis due to plants, except food: Secondary | ICD-10-CM | POA: Diagnosis not present

## 2023-10-03 DIAGNOSIS — I10 Essential (primary) hypertension: Secondary | ICD-10-CM | POA: Diagnosis not present

## 2023-10-03 DIAGNOSIS — R972 Elevated prostate specific antigen [PSA]: Secondary | ICD-10-CM | POA: Diagnosis not present

## 2023-10-03 DIAGNOSIS — E785 Hyperlipidemia, unspecified: Secondary | ICD-10-CM | POA: Diagnosis not present

## 2023-10-03 DIAGNOSIS — R7301 Impaired fasting glucose: Secondary | ICD-10-CM | POA: Diagnosis not present

## 2023-10-03 DIAGNOSIS — R82998 Other abnormal findings in urine: Secondary | ICD-10-CM | POA: Diagnosis not present

## 2023-10-09 DIAGNOSIS — Z1212 Encounter for screening for malignant neoplasm of rectum: Secondary | ICD-10-CM | POA: Diagnosis not present

## 2023-10-10 DIAGNOSIS — I700 Atherosclerosis of aorta: Secondary | ICD-10-CM | POA: Diagnosis not present

## 2023-10-10 DIAGNOSIS — Z0000 Encounter for general adult medical examination without abnormal findings: Secondary | ICD-10-CM | POA: Diagnosis not present

## 2023-10-10 DIAGNOSIS — Z1389 Encounter for screening for other disorder: Secondary | ICD-10-CM | POA: Diagnosis not present

## 2023-10-10 DIAGNOSIS — Z1331 Encounter for screening for depression: Secondary | ICD-10-CM | POA: Diagnosis not present

## 2024-01-09 DIAGNOSIS — G4733 Obstructive sleep apnea (adult) (pediatric): Secondary | ICD-10-CM | POA: Diagnosis not present

## 2024-01-19 NOTE — Progress Notes (Unsigned)
   [name], PhD, LAT, ATC acting as a scribe for Artist Lloyd, MD.  Jason Duarte is a 63 y.o. male who presents to [facility] today for L knee pain. Pt was previously seen by Dr. Lloyd on 02/01/22 for R trigger thumb.  Today, pt c/o L knee pain x over a year. Pt locates pain to the anterior aspect of his L knee, around the tibial tuberosity. In the evenings the swelling will go down and as he's active throughout the day, it will swell significantly.  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  L Knee swelling: yes Mechanical symptoms: yes Aggravates: increased activity Treatments tried: none  Pertinent review of systems: No fevers or chills  Relevant historical information: Hypertension.  History of trigger finger.   Exam:  BP (!) 138/92   Pulse 72   Ht 5' 11 (1.803 m)   Wt 202 lb (91.6 kg)   SpO2 96%   BMI 28.17 kg/m  General: Well Developed, well nourished, and in no acute distress.   MSK: Left knee visible swelling anterior inferior portion left knee at distal patellar tendon insertion.  Intact strength.  Not particularly tender to palpation.    Lab and Radiology Results  Diagnostic Limited MSK Ultrasound of: Left knee patellar tendon Patellar tendon is intact. Superficial to patellar tendon distally near tibial tubercle there is a small area of hypoechoic fluid superficial to patellar tendon. Impression: Prepatellar bursitis     Assessment and Plan: 63 y.o. male with left knee prepatellar bursitis.  Ongoing chronic problem.  Plan for compression and Voltaren gel.  Check back in about 6 weeks if especially if not better.  Consider aspiration and injection then.   PDMP not reviewed this encounter. Orders Placed This Encounter  Procedures   [place]  LIMITED JOINT SPACE STRUCTURES LOW LEFT(NO LINKED CHARGES)    Reason for Exam (SYMPTOM  OR DIAGNOSIS REQUIRED):   left knee pain    Preferred  imaging location?:   [facility]   No orders of the defined types were placed in this encounter.    Discussed warning signs or symptoms. Please see discharge instructions. Patient expresses understanding.   The above documentation has been reviewed and is accurate and complete Artist Lloyd, M.D.

## 2024-01-20 ENCOUNTER — Ambulatory Visit (INDEPENDENT_AMBULATORY_CARE_PROVIDER_SITE_OTHER): Admitting: Family Medicine

## 2024-01-20 ENCOUNTER — Other Ambulatory Visit: Payer: Self-pay

## 2024-01-20 VITALS — BP 138/92 | HR 72 | Ht 71.0 in | Wt 202.0 lb

## 2024-01-20 DIAGNOSIS — G8929 Other chronic pain: Secondary | ICD-10-CM

## 2024-01-20 DIAGNOSIS — M7042 Prepatellar bursitis, left knee: Secondary | ICD-10-CM | POA: Diagnosis not present

## 2024-01-20 DIAGNOSIS — M25562 Pain in left knee: Secondary | ICD-10-CM

## 2024-01-20 NOTE — Patient Instructions (Addendum)
 Thank you for coming in today.   Check back in 6 weeks  Please use Voltaren gel (Generic Diclofenac Gel) up to 4x daily for pain as needed.  This is available over-the-counter as both the name brand Voltaren gel and the generic diclofenac gel.   I recommend you obtained a compression sleeve to help with your joint problems. There are many options on the market however I recommend obtaining a Full Knee Body Helix compression sleeve.  You can find information (including how to appropriate measure yourself for sizing) can be found at [email or web address] [email or web address].  Many of these products are health savings account (HSA) eligible.   You can use the compression sleeve at any time throughout the day but is most important to use while being active as well as for 2 hours post-activity.   It is appropriate to ice following activity with the compression sleeve in place.

## 2024-01-29 DIAGNOSIS — F4323 Adjustment disorder with mixed anxiety and depressed mood: Secondary | ICD-10-CM | POA: Diagnosis not present

## 2024-02-08 DIAGNOSIS — G4733 Obstructive sleep apnea (adult) (pediatric): Secondary | ICD-10-CM | POA: Diagnosis not present
